# Patient Record
Sex: Male | Born: 1955 | Race: White | Hispanic: No | Marital: Married | Smoking: Current every day smoker
Health system: Southern US, Community
[De-identification: ages and names within clinical notes are randomized; demographics above are authoritative.]

## PROBLEM LIST (undated history)

## (undated) DIAGNOSIS — I10 Essential (primary) hypertension: Secondary | ICD-10-CM

## (undated) DIAGNOSIS — N400 Enlarged prostate without lower urinary tract symptoms: Secondary | ICD-10-CM

## (undated) DIAGNOSIS — F419 Anxiety disorder, unspecified: Secondary | ICD-10-CM

## (undated) DIAGNOSIS — E7800 Pure hypercholesterolemia, unspecified: Secondary | ICD-10-CM

## (undated) HISTORY — DX: Anxiety disorder, unspecified: F41.9

## (undated) HISTORY — DX: Benign prostatic hyperplasia without lower urinary tract symptoms: N40.0

## (undated) HISTORY — DX: Pure hypercholesterolemia, unspecified: E78.00

## (undated) HISTORY — DX: Essential (primary) hypertension: I10

## (undated) HISTORY — PX: TONSILLECTOMY: SUR1361

---

## 2000-10-06 HISTORY — PX: HIP SURGERY: SHX245

## 2008-12-02 ENCOUNTER — Emergency Department: Payer: Self-pay | Admitting: Emergency Medicine

## 2019-12-12 ENCOUNTER — Ambulatory Visit
Admission: RE | Admit: 2019-12-12 | Discharge: 2019-12-12 | Disposition: A | Discharge status: Home / Self Care | Payer: Disability Insurance | Source: Ambulatory Visit | Attending: Thoracic Surgery | Admitting: Thoracic Surgery

## 2019-12-12 ENCOUNTER — Other Ambulatory Visit: Payer: Self-pay | Admitting: Thoracic Surgery

## 2019-12-12 DIAGNOSIS — M25552 Pain in left hip: Secondary | ICD-10-CM | POA: Insufficient documentation

## 2019-12-12 DIAGNOSIS — S3992XS Unspecified injury of lower back, sequela: Secondary | ICD-10-CM | POA: Insufficient documentation

## 2021-11-20 ENCOUNTER — Ambulatory Visit (INDEPENDENT_AMBULATORY_CARE_PROVIDER_SITE_OTHER): Payer: Medicare Other | Admitting: Urology

## 2021-11-20 ENCOUNTER — Other Ambulatory Visit: Payer: Self-pay

## 2021-11-20 ENCOUNTER — Encounter: Payer: Self-pay | Admitting: Urology

## 2021-11-20 VITALS — BP 161/97 | HR 63 | Ht 69.0 in | Wt 185.0 lb

## 2021-11-20 DIAGNOSIS — N410 Acute prostatitis: Secondary | ICD-10-CM | POA: Diagnosis not present

## 2021-11-20 DIAGNOSIS — N4281 Prostatodynia syndrome: Secondary | ICD-10-CM | POA: Diagnosis not present

## 2021-11-20 LAB — URINALYSIS, COMPLETE
Bilirubin, UA: NEGATIVE
Glucose, UA: NEGATIVE
Ketones, UA: NEGATIVE
Nitrite, UA: NEGATIVE
Protein,UA: NEGATIVE
Specific Gravity, UA: 1.015 (ref 1.005–1.030)
Urobilinogen, Ur: 0.2 mg/dL (ref 0.2–1.0)
pH, UA: 7 (ref 5.0–7.5)

## 2021-11-20 LAB — MICROSCOPIC EXAMINATION

## 2021-11-20 LAB — BLADDER SCAN AMB NON-IMAGING

## 2021-11-20 MED ORDER — CIPROFLOXACIN HCL 500 MG PO TABS: 500.0000 mg | ORAL_TABLET | Freq: Two times a day (BID) | ORAL | 0 refills | Status: AC

## 2021-11-20 MED ORDER — TAMSULOSIN HCL 0.4 MG PO CAPS: 0.4000 mg | ORAL_CAPSULE | Freq: Every day | ORAL | 0 refills | Status: DC

## 2021-11-20 NOTE — Progress Notes (Signed)
° °  11/20/21 3:27 PM   Osa Craver. 25-Jul-1956 [id number]  CC: Dysuria, pelvic pain, PSA screening, possible hematuria, ED  HPI: I saw Mr. Clink today for the above issues.  He is a 66 year old male with extensive 40+ pack year smoking history.  He reports about 7 months of intermittent urinary symptoms with terminal dysuria, possible blood in the urine, and perineal achiness.  He has been treated with antibiotics previously by PCP, but those records are not available to me.  He reports he gets improvement with antibiotics, but symptoms have returned.  I do not see any prior UA results from outside PCP, but PSA was normal at 0.5.  He has had trouble with erections for many years, and got no improvement with Viagra and Cialis and these gave him bothersome side effects as well.  He is not interested in resuming sexual activity at this time.  Urinalysis today 0-5 WBCs, 0-2 RBCs, few bacteria, no yeast, nitrite negative, 1+ leukocytes.  PVR normal at 50 mL.  He reportedly had a cystoscopy at least 10 years ago with Dr. Pandora Leiter. [facility] which was reportedly normal.  He had a fair amount of discomfort from this.  Family History: No family history on file.  Social History:  reports that he has been smoking cigarettes. He has never been exposed to tobacco smoke. He has never used smokeless tobacco. He reports current alcohol use. He reports that he does not use drugs.  Physical Exam: BP (!) 161/97    Pulse 63    Ht 5\' 9"  (1.753 m)    Wt 185 lb (83.9 kg)    BMI 27.32 kg/m    Constitutional:  Alert and oriented, No acute distress. Cardiovascular: No clubbing, cyanosis, or edema. Respiratory: Normal respiratory effort, no increased work of breathing. GI: Abdomen is soft, nontender, nondistended, no abdominal masses   Assessment & Plan:   66 year old male with dysuria and pelvic discomfort/perineal achiness, normal PVR, urinalysis with some mild leukocytes but no pyuria or  microscopic hematuria.  We reviewed possible etiologies of his symptoms at length including UTI, prostatitis, chronic prostatitis, chronic pelvic pain, or even malignancy like bladder cancer, prostate cancer, urethral pathology.  I recommended trial of Cipro 500 mg twice daily x3 weeks as well as Flomax nightly for possible prostatitis, with close follow-up in 3 to 4 weeks for likely cystoscopy.  We will repeat UA at that visit and if completely normal and symptoms have resolved can potentially avoid cystoscopy.  Nickolas Madrid, MD 11/20/2021  [facility] [address] [place], [place] [address] [phone]

## 2021-11-20 NOTE — Patient Instructions (Signed)
Prostatitis Prostatitis is swelling or inflammation of the prostate gland, also called the prostate. This gland is about 1.5 inches wide and 1 inch high, and it is involved in making semen. The prostate is located below a man's bladder, in front of the rectum. There are four types of prostatitis: Chronic prostatitis (CP), also called chronic pelvic pain syndrome (CPPS). This is the most common type of prostatitis. It is associated with increased muscle tone in the area between the hip bones (pelvic area), around the prostate. This type is also known as a pelvic floor disorder. Chronic bacterial prostatitis. This type usually results from an acute bacterial infection in the prostate gland that keeps coming back or has not been treated properly. The symptoms are less severe than those caused by acute bacterial prostatitis, which lasts a shorter time. Asymptomatic inflammatory prostatitis. This type does not have symptoms and does not need treatment. This is diagnosed when tests are done for other disorders of the urinary tract or reproductive tract. Acute bacterial prostatitis. This type starts quickly and results from an acute bacterial infection in the prostate gland. It is usually associated with a bladder infection, high fever, and chills. This is the least common type of prostatitis. What are the causes? Bacterial prostatitis is caused by an infection from bacteria. Chronic nonbacterial prostatitis may be caused by: Factors related to the nervous system. This system includes thebrain, spinal cord, and nerves. An autoimmune response. This happens when the body's disease-fighting system attacks healthy tissue in the body by mistake. Psychological factors. These have to do with how the mind works. The causes of the other types of prostatitis are usually not known. What are the signs or symptoms? Symptoms of this condition depend on the type of prostatitis you have. Acute bacterial  prostatitis Symptoms may include: Pain or burning during urination. Frequent and sudden urges to urinate. Trouble starting to urinate. Fever. Chills. Pain in your muscles or joints, lower back, or lower abdomen. Other types of prostatitis Symptoms may include: Sudden urges to urinate, or urinating often. Trouble starting to urinate. Weak urine stream. Dribbling after urination. Discharge coming from the penis. Pain in the testicles, the penis, or the tip of the penis. Pain in the area in front of the rectum and below the scrotum (perineum). Pain when ejaculating. How is this diagnosed? This condition may be diagnosed based on: A physical and medical exam. A digital rectal exam. For this, the health care provider may use a finger to feel the prostate. A urine test to check for bacteria. A semen sample or blood tests. Ultrasound. Urodynamic tests to check how your body handles urine. Cystoscopy to look inside your bladder or inside the part of your body that drains urine from the bladder (urethra). How is this treated? Treatment for this condition depends on the type of prostatitis. Treatment may involve: Medicines to relieve pain or inflammation, or to help relax your muscles. Physical therapy. Heat therapy. Biofeedback. These techniques help you control certain body functions. Relaxation exercises. Antibiotic medicine, if your condition is caused by bacteria. Sitz baths. These warm water baths help to relax your pelvic floor muscles, which helps to relieve pressure on the prostate. Follow these instructions at home: Medicines Take over-the-counter and prescription medicines only as told by your health care provider. If you were prescribed an antibiotic medicine, take it as told by your health care provider. Do not stop using the antibiotic even if you start to feel better. Managing pain and swelling  Take sitz baths as directed by your health care provider. For a sitz bath,  sit in warm water that is deep enough to cover your hips and buttocks. If directed, apply heat to the affected area as often as told by your health care provider. Use the heat source that your health care provider recommends, such as a moist heat pack or a heating pad. Place a towel between your skin and the heat source. Leave the heat on for 20-30 minutes. Remove the heat if your skin turns bright red. This is especially important if you are unable to feel pain, heat, or cold. You may have a greater risk of getting burned. General instructions Do exercises as told by your health care provider, if you were prescribed physical therapy, biofeedback, or relaxation exercises. Keep all follow-up visits as told by your health care provider. This is important. Where to find more information [employer] of Diabetes and Digestive and Kidney Diseases: http://www.bass.com/ Contact a health care provider if: Your symptoms get worse. You have a fever. Get help right away if: You have chills. You feel light-headed or feel like you may faint. You cannot urinate. You have blood or blood clots in your urine. Summary Prostatitis is swelling or inflammation of the prostate gland. Treatment for this condition depends on the type of prostatitis. Take over-the-counter and prescription medicines only as told by your health care provider. Get help right away of you have chills, feel light-headed, feel like you may faint, cannot urinate, or have blood or blood clots in your urine. This information is not intended to replace advice given to you by your health care provider. Make sure you discuss any questions you have with your health care provider. Document Revised: 10/28/2019 Document Reviewed: 10/28/2019 Elsevier Patient Education  2022 [place].  Cystoscopy Cystoscopy is a procedure that is used to help diagnose and sometimes treat conditions that affect the lower urinary tract. The lower  urinary tract includes the bladder and the urethra. The urethra is the tube that drains urine from the bladder. Cystoscopy is done using a thin, tube-shaped instrument with a light and camera at the end (cystoscope). The cystoscope may be hard or flexible, depending on the goal of the procedure. The cystoscope is inserted through the urethra, into the bladder. Cystoscopy may be recommended if you have: Urinary tract infections that keep coming back. Blood in the urine (hematuria). An inability to control when you urinate (urinary incontinence) or an overactive bladder. Unusual cells found in a urine sample. A blockage in the urethra, such as a urinary stone. Painful urination. An abnormality in the bladder found during an intravenous pyelogram (IVP) or CT scan. What are the risks? Generally, this is a safe procedure. However, problems may occur, including: Infection. Bleeding.  What happens during the procedure?  You will be given one or more of the following: A medicine to numb the area (local anesthetic). The area around the opening of your urethra will be cleaned. The cystoscope will be passed through your urethra into your bladder. Germ-free (sterile) fluid will flow through the cystoscope to fill your bladder. The fluid will stretch your bladder so that your health care provider can clearly examine your bladder walls. Your doctor will look at the urethra and bladder. The cystoscope will be removed The procedure may vary among health care providers  What can I expect after the procedure? After the procedure, it is common to have: Some soreness or pain in your urethra. Urinary symptoms.  These include: Mild pain or burning when you urinate. Pain should stop within a few minutes after you urinate. This may last for up to a few days after the procedure. A small amount of blood in your urine for several days. Feeling like you need to urinate but producing only a small amount of  urine. Follow these instructions at home: General instructions Return to your normal activities as told by your health care provider.  Drink plenty of fluids after the procedure. Keep all follow-up visits as told by your health care provider. This is important. Contact a health care provider if you: Have pain that gets worse or does not get better with medicine, especially pain when you urinate lasting longer than 72 hours after the procedure. Have trouble urinating. Get help right away if you: Have blood clots in your urine. Have a fever or chills. Are unable to urinate. Summary Cystoscopy is a procedure that is used to help diagnose and sometimes treat conditions that affect the lower urinary tract. Cystoscopy is done using a thin, tube-shaped instrument with a light and camera at the end. After the procedure, it is common to have some soreness or pain in your urethra. It is normal to have blood in your urine after the procedure.  If you were prescribed an antibiotic medicine, take it as told by your health care provider.  This information is not intended to replace advice given to you by your health care provider. Make sure you discuss any questions you have with your health care provider. Document Revised: 09/14/2018 Document Reviewed: 09/14/2018 Elsevier Patient Education  [place].

## 2021-11-20 NOTE — Addendum Note (Signed)
Addended by: Despina Hidden on: 11/20/2021 03:39 PM   Modules accepted: Orders

## 2021-11-23 LAB — CULTURE, URINE COMPREHENSIVE

## 2021-12-12 ENCOUNTER — Other Ambulatory Visit: Payer: Self-pay | Admitting: Nephrology

## 2021-12-12 DIAGNOSIS — F419 Anxiety disorder, unspecified: Secondary | ICD-10-CM | POA: Insufficient documentation

## 2021-12-12 DIAGNOSIS — N400 Enlarged prostate without lower urinary tract symptoms: Secondary | ICD-10-CM | POA: Insufficient documentation

## 2021-12-12 DIAGNOSIS — N401 Enlarged prostate with lower urinary tract symptoms: Secondary | ICD-10-CM

## 2021-12-12 DIAGNOSIS — N1831 Chronic kidney disease, stage 3a: Secondary | ICD-10-CM

## 2021-12-12 DIAGNOSIS — R82998 Other abnormal findings in urine: Secondary | ICD-10-CM

## 2021-12-12 DIAGNOSIS — E785 Hyperlipidemia, unspecified: Secondary | ICD-10-CM

## 2021-12-12 DIAGNOSIS — I10 Essential (primary) hypertension: Secondary | ICD-10-CM | POA: Insufficient documentation

## 2021-12-12 DIAGNOSIS — R3129 Other microscopic hematuria: Secondary | ICD-10-CM

## 2021-12-19 ENCOUNTER — Ambulatory Visit
Admission: RE | Admit: 2021-12-19 | Discharge: 2021-12-19 | Disposition: A | Discharge status: Home / Self Care | Payer: Medicare HMO | Source: Ambulatory Visit | Attending: Nephrology | Admitting: Nephrology

## 2021-12-19 ENCOUNTER — Other Ambulatory Visit: Payer: Self-pay

## 2021-12-19 DIAGNOSIS — F419 Anxiety disorder, unspecified: Secondary | ICD-10-CM | POA: Insufficient documentation

## 2021-12-19 DIAGNOSIS — N401 Enlarged prostate with lower urinary tract symptoms: Secondary | ICD-10-CM | POA: Insufficient documentation

## 2021-12-19 DIAGNOSIS — R3129 Other microscopic hematuria: Secondary | ICD-10-CM | POA: Diagnosis present

## 2021-12-19 DIAGNOSIS — N1831 Chronic kidney disease, stage 3a: Secondary | ICD-10-CM | POA: Diagnosis present

## 2021-12-19 DIAGNOSIS — E785 Hyperlipidemia, unspecified: Secondary | ICD-10-CM | POA: Diagnosis present

## 2021-12-19 DIAGNOSIS — R82998 Other abnormal findings in urine: Secondary | ICD-10-CM | POA: Diagnosis not present

## 2021-12-25 ENCOUNTER — Other Ambulatory Visit: Payer: Self-pay

## 2021-12-25 ENCOUNTER — Encounter: Payer: Self-pay | Admitting: Urology

## 2021-12-25 ENCOUNTER — Ambulatory Visit (INDEPENDENT_AMBULATORY_CARE_PROVIDER_SITE_OTHER): Payer: Medicare HMO | Admitting: Urology

## 2021-12-25 VITALS — BP 184/99 | HR 68 | Ht 69.0 in | Wt 185.0 lb

## 2021-12-25 DIAGNOSIS — N410 Acute prostatitis: Secondary | ICD-10-CM

## 2021-12-25 NOTE — Progress Notes (Signed)
? ?  12/25/2021 ?3:20 PM  ? ?Martin Gomez. ?13-Mar-1956 ?[id number] ? ?Reason for visit: Follow up urinary symptoms, prostatitis,dysuria ? ?HPI: ?66 year old male who I saw on 11/20/2021 with about 6 months of urinary symptoms including terminal dysuria, questionable blood in the urine, and perineal pain/achiness.  He had some temporary improvement with short courses of antibiotics previously.  His clinical picture was most consistent with prostatitis, culture ultimately grew Klebsiella, and he was treated with 3 weeks of Cipro and Flomax.  He reports almost complete resolution in his symptoms since that time.  He denies any gross hematuria or dysuria, there was no microscopic hematuria on his prior urinalysis.  He is off the Flomax, and does not feel this significantly improved his urinary stream. ? ?We again discussed considering cystoscopy today for further evaluation of urethral stricture or other pathology, but he would like to hold off on cystoscopy, as he had one about 10 years ago that was extremely painful. ? ?Reassuringly, he had a renal/bladder ultrasound(ordered by nephrology for dipstick trace blood) recently on 12/19/2021 that I personally reviewed and interpreted and shows no hydronephrosis or evidence of bladder lesion. ? ?Return precautions were discussed extensively including gross hematuria, worsening urinary symptoms, or dysuria. ? ?RTC 6 months PVR and symptom check, consider cystoscopy again in the future if recurrence of symptoms ? ?Billey Co, MD ? ?[facility] ?[address] ?[place], [place] [address] ?[phone] ? ? ?

## 2021-12-26 LAB — URINALYSIS, COMPLETE
Bilirubin, UA: NEGATIVE
Glucose, UA: NEGATIVE
Ketones, UA: NEGATIVE
Leukocytes,UA: NEGATIVE
Nitrite, UA: NEGATIVE
Protein,UA: NEGATIVE
Specific Gravity, UA: 1.015 (ref 1.005–1.030)
Urobilinogen, Ur: 0.2 mg/dL (ref 0.2–1.0)
pH, UA: 7 (ref 5.0–7.5)

## 2021-12-26 LAB — MICROSCOPIC EXAMINATION
Bacteria, UA: NONE SEEN
Epithelial Cells (non renal): NONE SEEN /hpf (ref 0–10)

## 2022-04-24 ENCOUNTER — Encounter: Payer: Self-pay | Admitting: Urology

## 2022-04-24 ENCOUNTER — Ambulatory Visit: Payer: Medicare HMO | Admitting: Urology

## 2022-04-24 VITALS — BP 137/87 | HR 69 | Ht 68.0 in | Wt 185.0 lb

## 2022-04-24 DIAGNOSIS — R300 Dysuria: Secondary | ICD-10-CM | POA: Diagnosis not present

## 2022-04-24 DIAGNOSIS — R310 Gross hematuria: Secondary | ICD-10-CM | POA: Diagnosis not present

## 2022-04-24 DIAGNOSIS — Z87438 Personal history of other diseases of male genital organs: Secondary | ICD-10-CM

## 2022-04-24 DIAGNOSIS — N4281 Prostatodynia syndrome: Secondary | ICD-10-CM | POA: Diagnosis not present

## 2022-04-24 LAB — URINALYSIS, COMPLETE
Bilirubin, UA: NEGATIVE
Glucose, UA: NEGATIVE
Ketones, UA: NEGATIVE
Nitrite, UA: NEGATIVE
Protein,UA: NEGATIVE
RBC, UA: NEGATIVE
Specific Gravity, UA: <1.005 — ABNORMAL LOW (ref 1.005–1.030)
Urobilinogen, Ur: 0.2 mg/dL (ref 0.2–1.0)
pH, UA: 6.5 (ref 5.0–7.5)

## 2022-04-24 LAB — MICROSCOPIC EXAMINATION

## 2022-04-24 LAB — BLADDER SCAN AMB NON-IMAGING

## 2022-04-24 MED ORDER — DIAZEPAM 5 MG PO TABS: 5.0000 mg | ORAL_TABLET | Freq: Once | ORAL | 0 refills | Status: DC | PRN

## 2022-04-24 MED ORDER — SULFAMETHOXAZOLE-TRIMETHOPRIM 800-160 MG PO TABS: 1.0000 | ORAL_TABLET | Freq: Two times a day (BID) | ORAL | 0 refills | Status: AC

## 2022-04-24 NOTE — Patient Instructions (Signed)
Prostatitis  Prostatitis is swelling or inflammation of the prostate gland, also called the prostate. This gland is about 1.5 inches wide and 1 inch high, and it is involved in making semen. The prostate is located below a man's bladder, in front of the rectum. There are four types of prostatitis: Chronic prostatitis (CP), also called chronic pelvic pain syndrome (CPPS). This is the most common type of prostatitis. It is associated with increased muscle tone in the area between the hip bones (pelvic area), around the prostate. This type is also known as a pelvic floor disorder. Chronic bacterial prostatitis. This type usually results from an acute bacterial infection in the prostate gland that keeps coming back or has not been treated properly. The symptoms are less severe than those caused by acute bacterial prostatitis, which lasts a shorter time. Asymptomatic inflammatory prostatitis. This type does not have symptoms and does not need treatment. This is diagnosed when tests are done for other disorders of the urinary tract or reproductive tract. Acute bacterial prostatitis. This type starts quickly and results from an acute bacterial infection in the prostate gland. It is usually associated with a bladder infection, high fever, and chills. This is the least common type of prostatitis. What are the causes? Bacterial prostatitis is caused by an infection from bacteria. Chronic nonbacterial prostatitis may be caused by: Factors related to the nervous system. This system includes thebrain, spinal cord, and nerves. An autoimmune response. This happens when the body's disease-fighting system attacks healthy tissue in the body by mistake. Psychological factors. These have to do with how the mind works. The causes of the other types of prostatitis are usually not known. What are the signs or symptoms? Symptoms of this condition depend on the type of prostatitis you have. Acute bacterial  prostatitis Symptoms may include: Pain or burning during urination. Frequent and sudden urges to urinate. Trouble starting to urinate. Fever. Chills. Pain in your muscles or joints, lower back, or lower abdomen. Other types of prostatitis Symptoms may include: Sudden urges to urinate, or urinating often. Trouble starting to urinate. Weak urine stream. Dribbling after urination. Discharge coming from the penis. Pain in the testicles, the penis, or the tip of the penis. Pain in the area in front of the rectum and below the scrotum (perineum). Pain when ejaculating. How is this diagnosed? This condition may be diagnosed based on: A physical and medical exam. A digital rectal exam. For this, the health care provider may use a finger to feel the prostate. A urine test to check for bacteria. A semen sample or blood tests. Ultrasound. Urodynamic tests to check how your body handles urine. Cystoscopy to look inside your bladder or inside the part of your body that drains urine from the bladder (urethra). How is this treated? Treatment for this condition depends on the type of prostatitis. Treatment may involve: Medicines to relieve pain or inflammation, or to help relax your muscles. Physical therapy. Heat therapy. Biofeedback. These techniques help you control certain body functions. Relaxation exercises. Antibiotic medicine, if your condition is caused by bacteria. Sitz baths. These warm water baths help to relax your pelvic floor muscles, which helps to relieve pressure on the prostate. Follow these instructions at home: Medicines Take over-the-counter and prescription medicines only as told by your health care provider. If you were prescribed an antibiotic medicine, take it as told by your health care provider. Do not stop using the antibiotic even if you start to feel better. Managing pain and swelling  Take sitz baths as directed by your health care provider. For a sitz bath,  sit in warm water that is deep enough to cover your hips and buttocks. If directed, apply heat to the affected area as often as told by your health care provider. Use the heat source that your health care provider recommends, such as a moist heat pack or a heating pad. Place a towel between your skin and the heat source. Leave the heat on for 20-30 minutes. Remove the heat if your skin turns bright red. This is especially important if you are unable to feel pain, heat, or cold. You may have a greater risk of getting burned. General instructions Do exercises as told by your health care provider, if you were prescribed physical therapy, biofeedback, or relaxation exercises. Keep all follow-up visits as told by your health care provider. This is important. Where to find more information [employer] of Diabetes and Digestive and Kidney Diseases: http://www.bass.com/ Contact a health care provider if: Your symptoms get worse. You have a fever. Get help right away if: You have chills. You feel light-headed or feel like you may faint. You cannot urinate. You have blood or blood clots in your urine. Summary Prostatitis is swelling or inflammation of the prostate gland. Treatment for this condition depends on the type of prostatitis. Take over-the-counter and prescription medicines only as told by your health care provider. Get help right away of you have chills, feel light-headed, feel like you may faint, cannot urinate, or have blood or blood clots in your urine. This information is not intended to replace advice given to you by your health care provider. Make sure you discuss any questions you have with your health care provider. Document Revised: 10/28/2019 Document Reviewed: 10/28/2019 Elsevier Patient Education  [place].   Cystoscopy Cystoscopy is a procedure that is used to help diagnose and sometimes treat conditions that affect the lower urinary tract. The lower  urinary tract includes the bladder and the urethra. The urethra is the tube that drains urine from the bladder. Cystoscopy is done using a thin, tube-shaped instrument with a light and camera at the end (cystoscope). The cystoscope may be hard or flexible, depending on the goal of the procedure. The cystoscope is inserted through the urethra, into the bladder. Cystoscopy may be recommended if you have: Urinary tract infections that keep coming back. Blood in the urine (hematuria). An inability to control when you urinate (urinary incontinence) or an overactive bladder. Unusual cells found in a urine sample. A blockage in the urethra, such as a urinary stone. Painful urination. An abnormality in the bladder found during an intravenous pyelogram (IVP) or CT scan. What are the risks? Generally, this is a safe procedure. However, problems may occur, including: Infection. Bleeding.  What happens during the procedure?  You will be given one or more of the following: A medicine to numb the area (local anesthetic). The area around the opening of your urethra will be cleaned. The cystoscope will be passed through your urethra into your bladder. Germ-free (sterile) fluid will flow through the cystoscope to fill your bladder. The fluid will stretch your bladder so that your health care provider can clearly examine your bladder walls. Your doctor will look at the urethra and bladder. The cystoscope will be removed The procedure may vary among health care providers  What can I expect after the procedure? After the procedure, it is common to have: Some soreness or pain in your urethra. Urinary  symptoms. These include: Mild pain or burning when you urinate. Pain should stop within a few minutes after you urinate. This may last for up to a few days after the procedure. A small amount of blood in your urine for several days. Feeling like you need to urinate but producing only a small amount of  urine. Follow these instructions at home: General instructions Return to your normal activities as told by your health care provider.  Drink plenty of fluids after the procedure. Keep all follow-up visits as told by your health care provider. This is important. Contact a health care provider if you: Have pain that gets worse or does not get better with medicine, especially pain when you urinate lasting longer than 72 hours after the procedure. Have trouble urinating. Get help right away if you: Have blood clots in your urine. Have a fever or chills. Are unable to urinate. Summary Cystoscopy is a procedure that is used to help diagnose and sometimes treat conditions that affect the lower urinary tract. Cystoscopy is done using a thin, tube-shaped instrument with a light and camera at the end. After the procedure, it is common to have some soreness or pain in your urethra. It is normal to have blood in your urine after the procedure.  If you were prescribed an antibiotic medicine, take it as told by your health care provider.  This information is not intended to replace advice given to you by your health care provider. Make sure you discuss any questions you have with your health care provider. Document Revised: 09/14/2018 Document Reviewed: 09/14/2018 Elsevier Patient Education  [place].

## 2022-04-24 NOTE — Progress Notes (Signed)
   04/24/2022 11:31 AM   Martin Gomez. 12-17-1955 [id number]  Reason for visit: Dysuria, gross hematuria, history of prostatitis  HPI: 66 year old male who I saw in February March 2023 for prostatitis with culture growing Klebsiella and symptoms resolved on Cipro.  I had recommended cystoscopy, but he deferred.  A renal and bladder ultrasound that was ordered by nephrology in March 2023 showed no hydronephrosis or bladder lesions.  He presented to clinic today with some intermittent dysuria as well as gross hematuria and stringy blood clots.  PVR today is normal at 19 mL.  Urinalysis with 6-10 WBCs, 0-2 RBCs, few bacteria, no yeast, nitrite negative, 1+ leukocytes.  Will send for culture.  We reviewed possible etiologies including prostatitis, urethral stricture, BPH, or malignancy.  I recommended starting Bactrim DS twice daily x4 weeks for possible prostatitis with his history of infections, follow-up urine culture, and close follow-up for CT urogram with the significant bleeding and clots as well as recurrent infections, and cystoscopy for further evaluation.  Billey Co, [facility] [address], [place],  [address] [phone]

## 2022-04-28 LAB — CULTURE, URINE COMPREHENSIVE

## 2022-04-30 ENCOUNTER — Ambulatory Visit: Admission: RE | Admit: 2022-04-30 | Payer: Medicare HMO | Source: Ambulatory Visit

## 2022-05-07 ENCOUNTER — Ambulatory Visit
Admission: RE | Admit: 2022-05-07 | Discharge: 2022-05-07 | Disposition: A | Discharge status: Home / Self Care | Payer: Medicare HMO | Source: Ambulatory Visit | Attending: Urology | Admitting: Urology

## 2022-05-07 DIAGNOSIS — D3502 Benign neoplasm of left adrenal gland: Secondary | ICD-10-CM | POA: Diagnosis not present

## 2022-05-07 DIAGNOSIS — Z87438 Personal history of other diseases of male genital organs: Secondary | ICD-10-CM | POA: Insufficient documentation

## 2022-05-07 DIAGNOSIS — N200 Calculus of kidney: Secondary | ICD-10-CM | POA: Diagnosis not present

## 2022-05-07 DIAGNOSIS — R310 Gross hematuria: Secondary | ICD-10-CM | POA: Diagnosis not present

## 2022-05-07 LAB — POCT I-STAT CREATININE: Creatinine, Ser: 1.4 mg/dL — ABNORMAL HIGH (ref 0.61–1.24)

## 2022-05-07 MED ORDER — IOHEXOL 300 MG/ML  SOLN
125.0000 mL | Freq: Once | INTRAMUSCULAR | Status: AC | PRN
  Administered 2022-05-07: 125 mL via INTRAVENOUS

## 2022-05-08 ENCOUNTER — Telehealth: Payer: Self-pay

## 2022-05-08 NOTE — Telephone Encounter (Signed)
Called pt informed him of the information below. Pt voiced understanding.

## 2022-05-08 NOTE — Telephone Encounter (Signed)
-----   Message from Billey Co, MD sent at 05/08/2022 10:28 AM EDT ----- Good news, no worrisome findings on CT scan, keep follow up as scheduled  Nickolas Madrid, MD 05/08/2022

## 2022-05-21 ENCOUNTER — Encounter: Payer: Self-pay | Admitting: Urology

## 2022-05-21 ENCOUNTER — Ambulatory Visit (INDEPENDENT_AMBULATORY_CARE_PROVIDER_SITE_OTHER): Payer: Medicare HMO | Admitting: Urology

## 2022-05-21 VITALS — BP 131/71 | HR 79 | Ht 68.0 in | Wt 185.0 lb

## 2022-05-21 DIAGNOSIS — R310 Gross hematuria: Secondary | ICD-10-CM

## 2022-05-21 MED ORDER — LIDOCAINE HCL URETHRAL/MUCOSAL 2 % EX GEL
1.0000 | Freq: Once | CUTANEOUS | Status: AC
  Administered 2022-05-21: 1 via URETHRAL

## 2022-05-21 NOTE — Patient Instructions (Signed)

## 2022-05-21 NOTE — Progress Notes (Signed)
Cystoscopy Procedure Note:  Indication: Recurrent UTI/prostatitis, gross hematuria with clots  After informed consent and discussion of the procedure and its risks, Daylen Lipsky. was positioned and prepped in the standard fashion. Cystoscopy was performed with a flexible cystoscope. The urethra, bladder neck and entire bladder was visualized in a standard fashion. The prostate was moderate in size. The ureteral orifices were visualized in their normal location and orientation.  Bladder mucosa grossly normal throughout, no suspicious lesions, no abnormalities on retroflexion.  Imaging: CT urogram 05/08/2022 with no hydronephrosis or significant stone burden, gas in the bladder which may be from recent infection.  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  He has continued to improve on a month-long course of culture appropriate Bactrim for urine culture with Enterobacter on 04/24/2022.  Hematuria has resolved.  I recommended starting cranberry tablets to help with UTI prevention, and we could consider HOLEP in the future if recurrent infections.  He has previously trialed Flomax with minimal to no improvement.  Return precautions discussed at length.  RTC 6 months PVR  Nickolas Madrid, MD 05/21/2022

## 2022-07-10 ENCOUNTER — Ambulatory Visit: Payer: Medicare HMO | Admitting: Urology

## 2022-08-06 ENCOUNTER — Ambulatory Visit: Payer: Medicare PPO | Admitting: Urology

## 2022-08-06 VITALS — BP 154/79 | HR 73 | Ht 68.0 in | Wt 185.0 lb

## 2022-08-06 DIAGNOSIS — R310 Gross hematuria: Secondary | ICD-10-CM

## 2022-08-06 DIAGNOSIS — R300 Dysuria: Secondary | ICD-10-CM | POA: Diagnosis not present

## 2022-08-06 LAB — URINALYSIS, COMPLETE
Bilirubin, UA: NEGATIVE
Glucose, UA: NEGATIVE
Ketones, UA: NEGATIVE
Nitrite, UA: NEGATIVE
Protein,UA: NEGATIVE
RBC, UA: NEGATIVE
Specific Gravity, UA: <1.005 — ABNORMAL LOW (ref 1.005–1.030)
Urobilinogen, Ur: 0.2 mg/dL (ref 0.2–1.0)
pH, UA: 6 (ref 5.0–7.5)

## 2022-08-06 LAB — BLADDER SCAN AMB NON-IMAGING: Scan Result: 12

## 2022-08-06 LAB — MICROSCOPIC EXAMINATION

## 2022-08-06 MED ORDER — DOXYCYCLINE HYCLATE 100 MG PO CAPS: 100.0000 mg | ORAL_CAPSULE | Freq: Two times a day (BID) | ORAL | 0 refills | Status: AC

## 2022-08-06 NOTE — Progress Notes (Signed)
   08/06/2022 11:49 AM   Martin Gomez. [birth year] [id number]  Reason for visit: Follow up recurrent UTI/prostatitis, gross hematuria  HPI: 66 year old male who has had problems over the last 6 months with intermittent dysuria and UTI/prostatitis as well as gross hematuria with small clots.  PVRs have been normal.  He ultimately was treated with a 4-week course of Bactrim DS twice daily which resolved his symptoms.  Secondary to his significant hematuria he also opted for work-up with CT urogram and cystoscopy.  CT on 05/07/2022 was benign aside from gas in the bladder felt to be secondary to recent infection, and cystoscopy on 05/21/2022 was normal with no abnormalities or suspicious lesions.  He made an appointment today because he has continued to have intermittent gross hematuria with small clots.  He denies any dysuria or urinary symptoms.  Urinalysis today is suspicious for infection with 6-10 WBCs, 0-2 RBC, few bacteria, nitrite negative, trace leukocytes.  Will send for culture.  We discussed possible etiologies including BPH, prostatitis, or malignancy.  I think with his ongoing hematuria it is worth sending a urine cytology, and following up urine culture.  I started him on a 1 month course of doxycycline 100 mg twice daily for possible infection based on his history of prostatitis and urinalysis that appears infected.  Call with urine culture and urine cytology results Doxycycline 100 mg twice daily x1 month for suspected prostatitis   Billey Co, [place] [address] [place], [place] [address] [phone]

## 2022-08-08 LAB — CYTOLOGY - NON PAP

## 2022-08-10 LAB — CULTURE, URINE COMPREHENSIVE

## 2022-08-12 ENCOUNTER — Telehealth: Payer: Self-pay

## 2022-08-12 NOTE — Telephone Encounter (Signed)
Called no answer. Left detailed message per DPR advising pt of information below. Advised pt to call back for questions or concerns.

## 2022-08-12 NOTE — Telephone Encounter (Signed)
-----   Message from Billey Co, MD sent at 08/11/2022 10:22 AM EST ----- Good news, no cancer cells on urine specimen. Bacteria was sensitive to antibiotic, keep follow up as scheduled  Nickolas Madrid, MD 08/11/2022

## 2022-09-10 ENCOUNTER — Ambulatory Visit (INDEPENDENT_AMBULATORY_CARE_PROVIDER_SITE_OTHER): Payer: Medicare PPO | Admitting: Urology

## 2022-09-10 ENCOUNTER — Encounter: Payer: Self-pay | Admitting: Urology

## 2022-09-10 VITALS — BP 136/83 | HR 79 | Ht 69.0 in | Wt 185.0 lb

## 2022-09-10 DIAGNOSIS — R310 Gross hematuria: Secondary | ICD-10-CM

## 2022-09-10 DIAGNOSIS — N411 Chronic prostatitis: Secondary | ICD-10-CM

## 2022-09-10 MED ORDER — FINASTERIDE 5 MG PO TABS: 5.0000 mg | ORAL_TABLET | Freq: Every day | ORAL | 11 refills | Status: DC

## 2022-09-10 MED ORDER — SULFAMETHOXAZOLE-TRIMETHOPRIM 800-160 MG PO TABS: 1.0000 | ORAL_TABLET | Freq: Two times a day (BID) | ORAL | 0 refills | Status: AC

## 2022-09-10 NOTE — Progress Notes (Signed)
   09/10/2022 12:38 PM   Martin Gomez. Sep 01, 1956 [id number]  Reason for visit: Follow up recurrent UTI/prostatitis, gross hematuria  HPI: 66 year old male who has had problems over the last 8 months with intermittent dysuria and UTI/prostatitis as well as gross hematuria with small clots.  PVRs have been normal.  He ultimately was treated with a 4-week course of Bactrim DS twice daily which resolved his symptoms.  Secondary to his significant hematuria he also opted for work-up with CT urogram and cystoscopy.  CT on 05/07/2022 was benign aside from gas in the bladder felt to be secondary to recent infection, and cystoscopy on 05/21/2022 was normal with no abnormalities or suspicious lesions.  He developed recurrence of some dysuria and urinary symptoms as well as gross hematuria on 08/06/2022 and urine culture grew Klebsiella, he was treated with a 4-week course of doxycycline which resolved his urinary symptoms and hematuria for a few weeks, however his hematuria has recurred over the last few weeks with some small clots.  Urine cytology at that visit was negative.  Urinalysis and culture are pending today.  DRE today 30 g gland, benign with no nodules or masses.  We discussed possible etiologies including BPH, prostatitis, or malignancy.  His hematuria and symptoms have seemed to improve with antibiotics previously which lead to prostatitis being a more likely diagnosis, and workup for malignancy including CT, cystoscopy, and voided cytology have all been negative.  We reviewed possible benefit of finasteride for any BPH component, and he was interested in starting this as well, as well as consideration of an outlet procedure like HOLEP in the future to remove this prostate tissue that continues to get infected.  He is very hesitant to undergo any surgery.  -Call with urine culture and urine cytology results -Bactrim DS twice daily x 6 weeks -Start finasteride -RTC 2 to 3 months symptom  check   Billey Co, MD  [facility] [address], [place], [place] [address] [phone]

## 2022-09-11 ENCOUNTER — Telehealth: Payer: Self-pay

## 2022-09-11 LAB — URINALYSIS, COMPLETE
Bilirubin, UA: NEGATIVE
Glucose, UA: NEGATIVE
Ketones, UA: NEGATIVE
Leukocytes,UA: NEGATIVE
Nitrite, UA: NEGATIVE
Protein,UA: NEGATIVE
Specific Gravity, UA: <1.005 — ABNORMAL LOW (ref 1.005–1.030)
Urobilinogen, Ur: 0.2 mg/dL (ref 0.2–1.0)
pH, UA: 6 (ref 5.0–7.5)

## 2022-09-11 LAB — MICROSCOPIC EXAMINATION: Bacteria, UA: NONE SEEN

## 2022-09-11 NOTE — Telephone Encounter (Signed)
Called pt informed him of the information below. Pt voiced understanding.

## 2022-09-11 NOTE — Telephone Encounter (Signed)
-----   Message from Billey Co, MD sent at 09/11/2022  7:40 AM EST ----- UA looks normal, no blood or obvious infection, take bactrim and finasteride as recommended, keep February follow up as scheduled  Nickolas Madrid, MD 09/11/2022

## 2022-09-14 LAB — CULTURE, URINE COMPREHENSIVE

## 2022-09-17 IMAGING — US US RENAL
1 series · 15 of 25 positions shown · non-contrast
Comparison: None.

CLINICAL DATA: Microscopic hematuria

EXAM:
RENAL / URINARY TRACT ULTRASOUND COMPLETE

[Series 1: us renal · 15 of 80 slices shown]
[im 1/80]
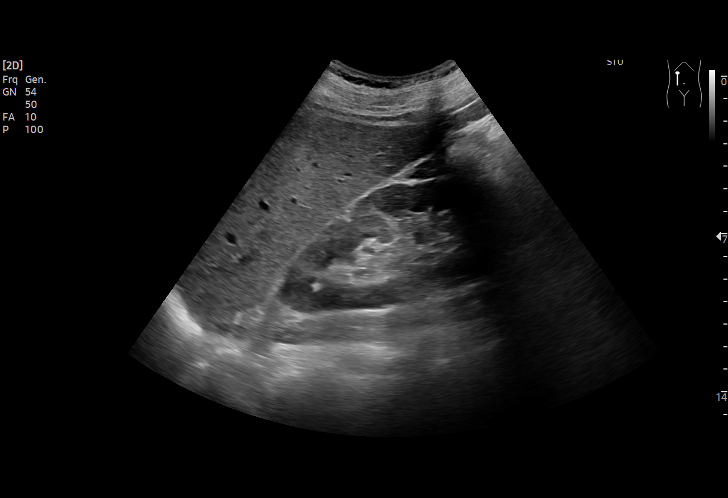
[im 7/80]
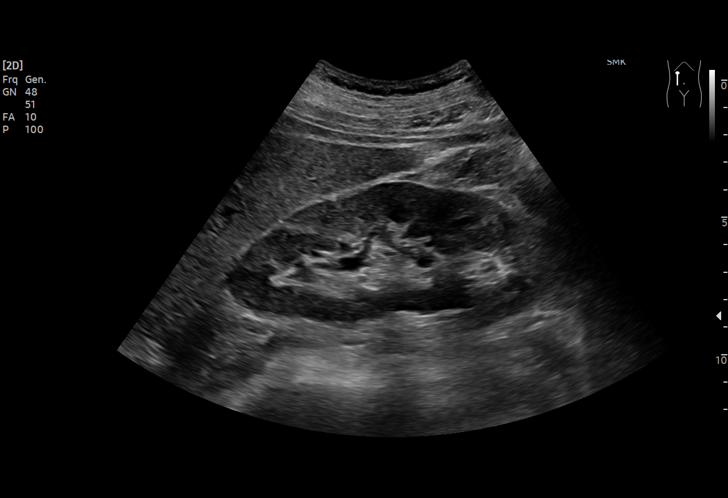
[im 14/80]
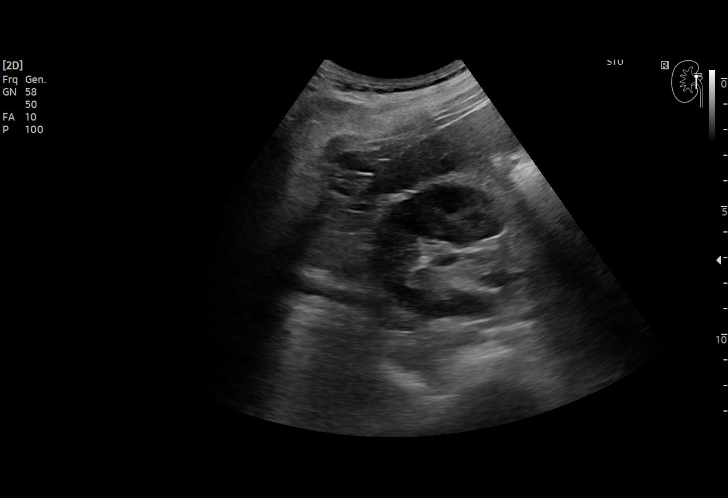
[im 17/80]
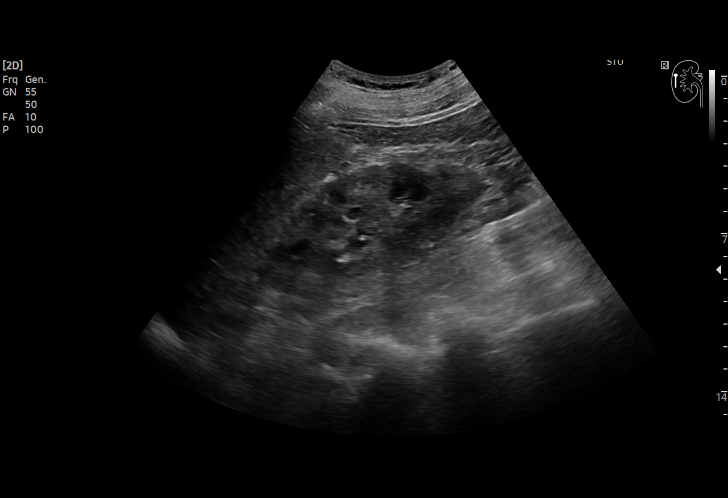
[im 24/80]
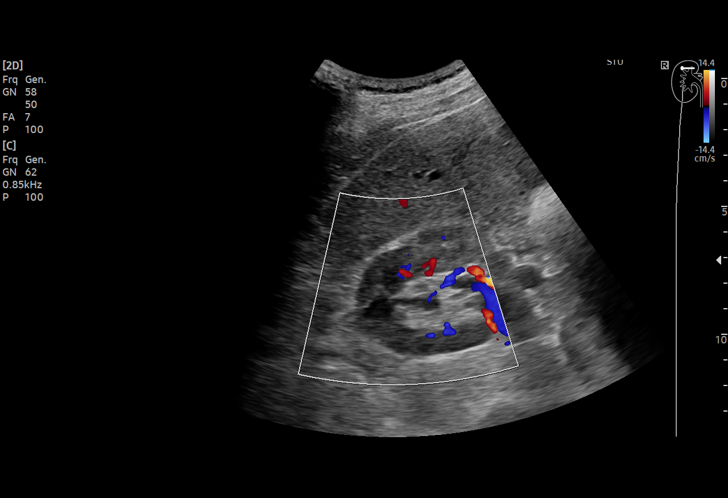
[im 30/80]
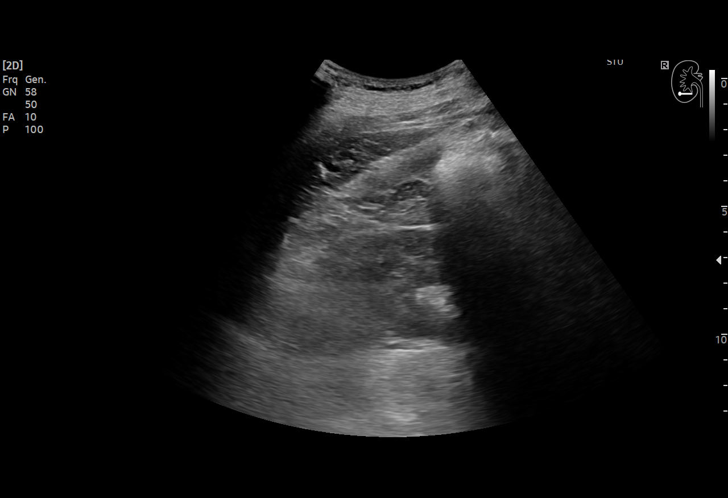
[im 33/80]
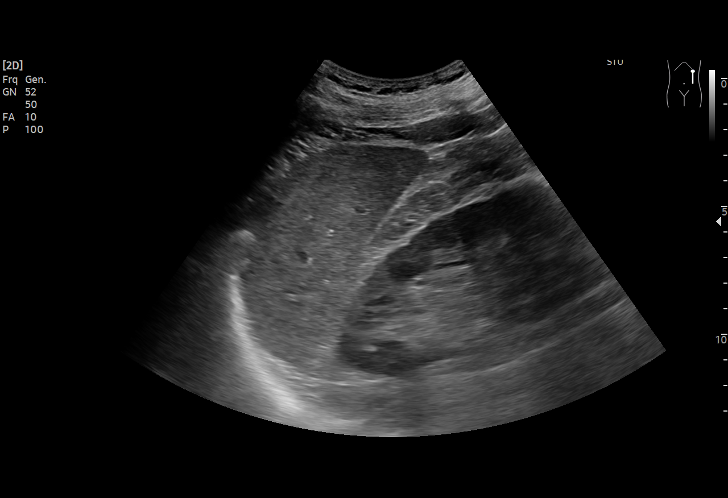
[im 40/80]
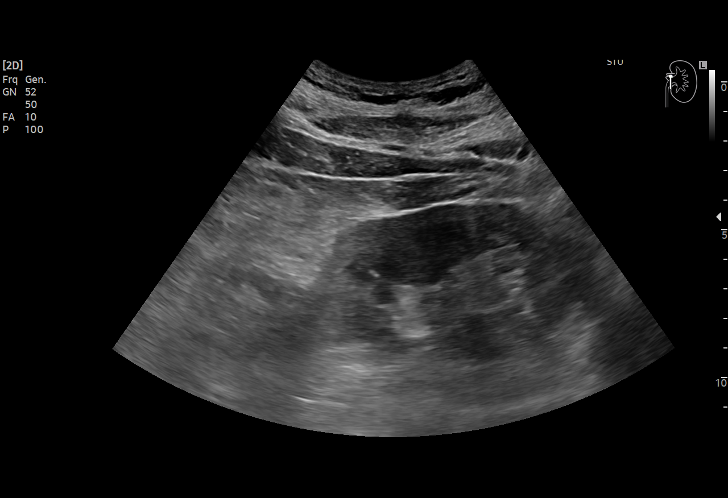
[im 47/80]
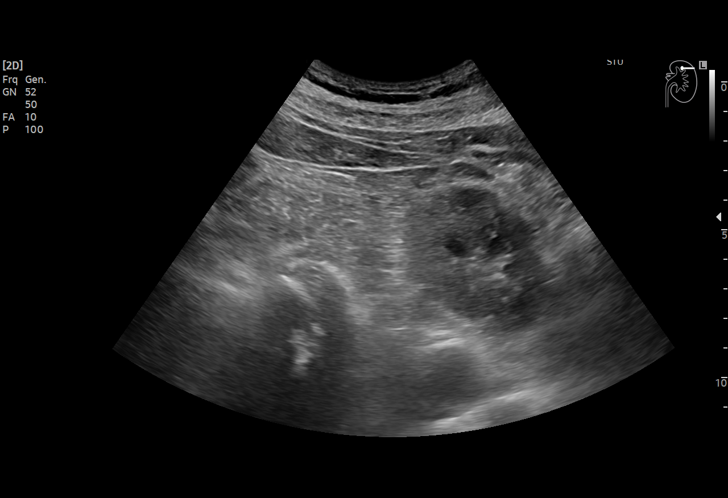
[im 50/80]
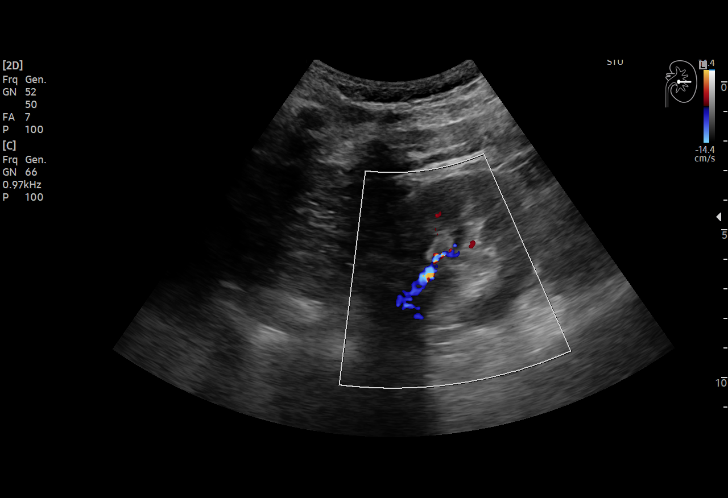
[im 56/80]
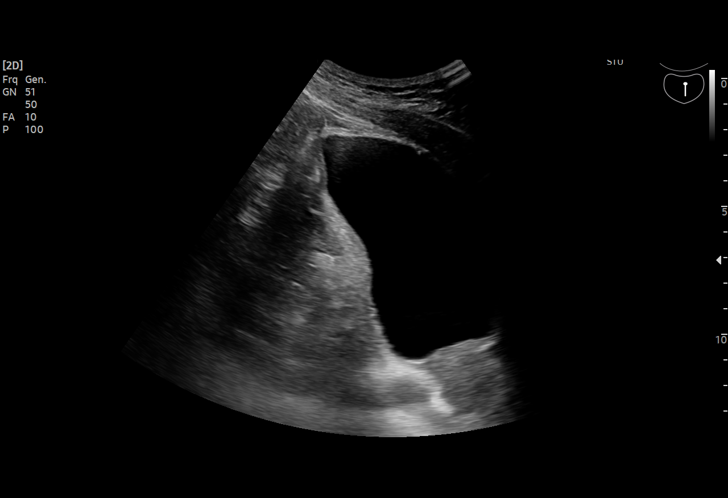
[im 63/80]
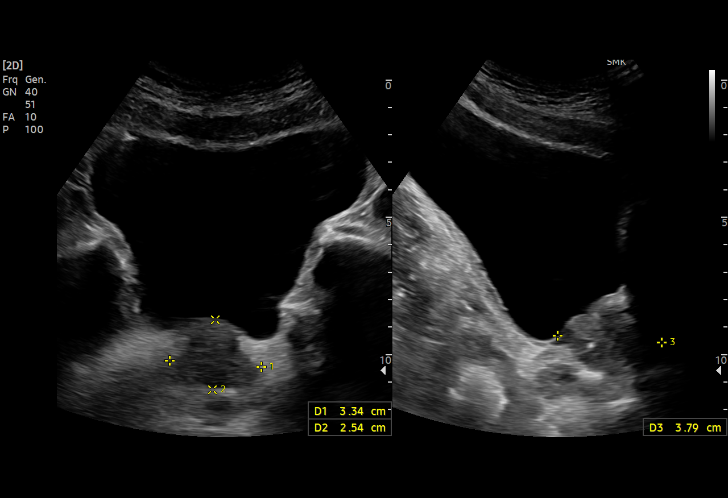
[im 66/80]
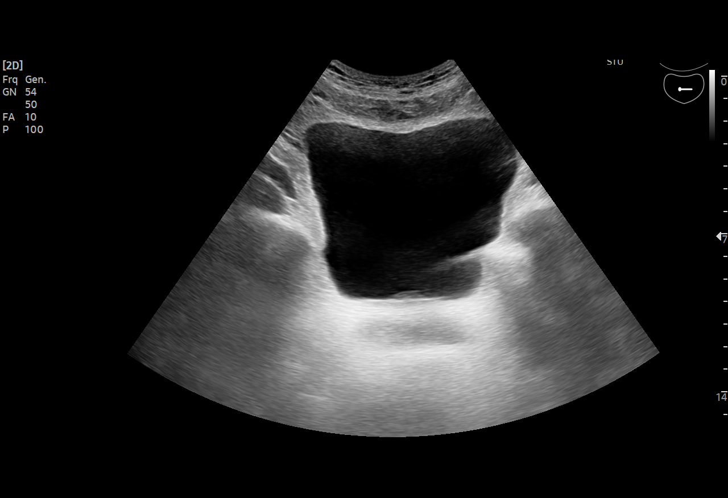
[im 73/80]
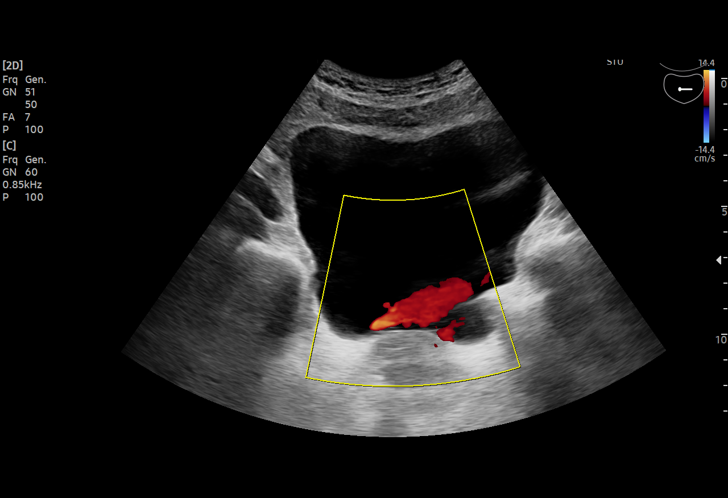
[im 80/80]
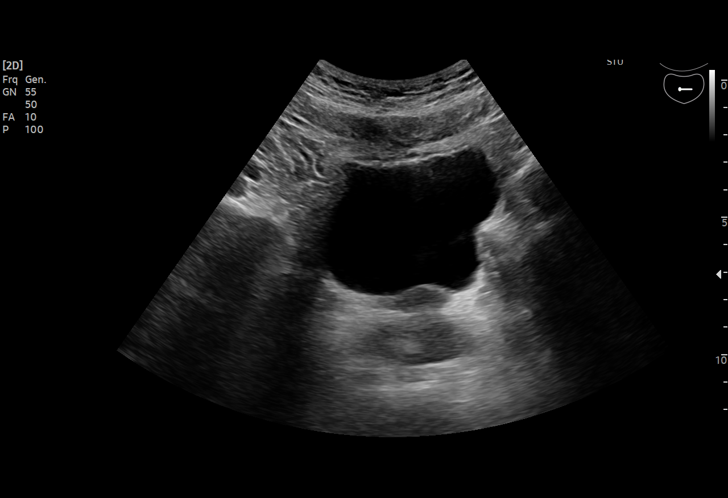

[15 of 25 positions shown; findings below may reference images not displayed]

FINDINGS: Right Kidney:

Renal measurements: 12.7 x 4.8 x 5.8 cm = volume: 185 mL.
Echogenicity within normal limits. No mass or hydronephrosis
visualized.

Left Kidney:

Renal measurements: 12.2 x 5.4 x 4.4 cm = volume: 149 mL.
Echogenicity within normal limits. No mass or hydronephrosis
visualized.

Bladder:

Appears normal for degree of bladder distention.

Other:

None.
IMPRESSION: Normal study.  No cause for the patient's symptoms identified.

## 2022-10-29 ENCOUNTER — Encounter: Payer: Self-pay | Admitting: *Deleted

## 2022-10-29 DIAGNOSIS — R918 Other nonspecific abnormal finding of lung field: Secondary | ICD-10-CM

## 2022-10-29 NOTE — Progress Notes (Signed)
Referral received for patient to follow up regarding abnormal LDCT. New pt appt scheduled. Pt and referring provider made aware. Pt informed of visitor and masking policy. Contact info given and instructed to call with any questions or needs. Pt verbalized understanding.

## 2022-10-30 ENCOUNTER — Ambulatory Visit
Admission: RE | Admit: 2022-10-30 | Discharge: 2022-10-30 | Disposition: A | Discharge status: Home / Self Care | Payer: Self-pay | Source: Ambulatory Visit | Attending: Internal Medicine | Admitting: Internal Medicine

## 2022-10-30 ENCOUNTER — Other Ambulatory Visit: Payer: Medicare HMO

## 2022-10-30 DIAGNOSIS — R918 Other nonspecific abnormal finding of lung field: Secondary | ICD-10-CM

## 2022-10-30 NOTE — Addendum Note (Signed)
Addended by: Telford Nab on: 10/30/2022 01:18 PM   Modules accepted: Orders

## 2022-10-30 NOTE — Progress Notes (Signed)
Outside images received from [facility]. Images uploaded to PACS.

## 2022-10-31 ENCOUNTER — Ambulatory Visit
Admission: RE | Admit: 2022-10-31 | Discharge: 2022-10-31 | Disposition: A | Discharge status: Home / Self Care | Payer: Medicare PPO | Source: Ambulatory Visit | Attending: Internal Medicine | Admitting: Internal Medicine

## 2022-10-31 ENCOUNTER — Encounter: Payer: Self-pay | Admitting: Internal Medicine

## 2022-10-31 ENCOUNTER — Inpatient Hospital Stay: Payer: Medicare PPO

## 2022-10-31 ENCOUNTER — Inpatient Hospital Stay: Payer: Medicare PPO | Attending: Internal Medicine | Admitting: Internal Medicine

## 2022-10-31 ENCOUNTER — Encounter: Payer: Self-pay | Admitting: *Deleted

## 2022-10-31 VITALS — BP 153/95 | HR 76 | Temp 97.4°F | Resp 18 | Ht 71.0 in | Wt 184.1 lb

## 2022-10-31 DIAGNOSIS — R918 Other nonspecific abnormal finding of lung field: Secondary | ICD-10-CM

## 2022-10-31 DIAGNOSIS — Z7952 Long term (current) use of systemic steroids: Secondary | ICD-10-CM | POA: Diagnosis not present

## 2022-10-31 DIAGNOSIS — J449 Chronic obstructive pulmonary disease, unspecified: Secondary | ICD-10-CM | POA: Insufficient documentation

## 2022-10-31 DIAGNOSIS — E279 Disorder of adrenal gland, unspecified: Secondary | ICD-10-CM | POA: Insufficient documentation

## 2022-10-31 DIAGNOSIS — Z79899 Other long term (current) drug therapy: Secondary | ICD-10-CM | POA: Insufficient documentation

## 2022-10-31 DIAGNOSIS — I10 Essential (primary) hypertension: Secondary | ICD-10-CM | POA: Diagnosis not present

## 2022-10-31 DIAGNOSIS — R059 Cough, unspecified: Secondary | ICD-10-CM | POA: Insufficient documentation

## 2022-10-31 DIAGNOSIS — F17210 Nicotine dependence, cigarettes, uncomplicated: Secondary | ICD-10-CM | POA: Insufficient documentation

## 2022-10-31 LAB — COMPREHENSIVE METABOLIC PANEL
ALT: 30 U/L (ref 0–44)
AST: 30 U/L (ref 15–41)
Albumin: 3.8 g/dL (ref 3.5–5.0)
Alkaline Phosphatase: 78 U/L (ref 38–126)
Anion gap: 10 (ref 5–15)
BUN: 14 mg/dL (ref 8–23)
CO2: 24 mmol/L (ref 22–32)
Calcium: 9.2 mg/dL (ref 8.9–10.3)
Chloride: 104 mmol/L (ref 98–111)
Creatinine, Ser: 1.25 mg/dL — ABNORMAL HIGH (ref 0.61–1.24)
GFR, Estimated: >60 mL/min (ref >60)
Glucose, Bld: 91 mg/dL (ref 70–99)
Potassium: 4.2 mmol/L (ref 3.5–5.1)
Sodium: 138 mmol/L (ref 135–145)
Total Bilirubin: 0.3 mg/dL (ref 0.3–1.2)
Total Protein: 7.6 g/dL (ref 6.5–8.1)

## 2022-10-31 LAB — CBC WITH DIFFERENTIAL/PLATELET
Abs Immature Granulocytes: 0.12 10*3/uL — ABNORMAL HIGH (ref 0.00–0.07)
Basophils Absolute: 0 10*3/uL (ref 0.0–0.1)
Basophils Relative: 0 %
Eosinophils Absolute: 0 10*3/uL (ref 0.0–0.5)
Eosinophils Relative: 0 %
HCT: 41.5 % (ref 39.0–52.0)
Hemoglobin: 13.9 g/dL (ref 13.0–17.0)
Immature Granulocytes: 1 %
Lymphocytes Relative: 33 %
Lymphs Abs: 3.8 10*3/uL (ref 0.7–4.0)
MCH: 31.7 pg (ref 26.0–34.0)
MCHC: 33.5 g/dL (ref 30.0–36.0)
MCV: 94.5 fL (ref 80.0–100.0)
Monocytes Absolute: 1 10*3/uL (ref 0.1–1.0)
Monocytes Relative: 9 %
Neutro Abs: 6.5 10*3/uL (ref 1.7–7.7)
Neutrophils Relative %: 57 %
Platelets: 345 10*3/uL (ref 150–400)
RBC: 4.39 MIL/uL (ref 4.22–5.81)
RDW: 13.6 % (ref 11.5–15.5)
WBC: 11.5 10*3/uL — ABNORMAL HIGH (ref 4.0–10.5)
nRBC: 0 % (ref 0.0–0.2)

## 2022-10-31 LAB — TSH: TSH: 2.048 u[IU]/mL (ref 0.350–4.500)

## 2022-10-31 LAB — LACTATE DEHYDROGENASE: LDH: 185 U/L (ref 98–192)

## 2022-10-31 MED ORDER — GADOBUTROL 1 MMOL/ML IV SOLN
7.5000 mL | Freq: Once | INTRAVENOUS | Status: AC | PRN
  Administered 2022-10-31: 7.5 mL via INTRAVENOUS

## 2022-10-31 MED ORDER — BENZONATATE 100 MG PO CAPS: 100.0000 mg | ORAL_CAPSULE | Freq: Three times a day (TID) | ORAL | 0 refills | Status: DC | PRN

## 2022-10-31 NOTE — Assessment & Plan Note (Addendum)
#  JAN 2024- CT-noncontrast lung cancer screening - approximately 4.5 cm left lower hilar mass; involving the mediastinum; multiple lesions; also adrenal lesion.  I reviewed the imaging with the patient and his wife in detail.  # Recommend PET scan for further evaluation/bone lesions. Patient will need biopsy to confirm the malignancy/NGS testing.  Discussed based on imaging this is concerning for stage IV lung cancer.  Unfortunately the stage IV cancers are incurable.  The median survival is average in the between 1 to 2 years. Patient also need brain MRI-given the risk of metastasis to brain.  # After the lengthy discussion patient-patient is reluctant with biopsy.  Patient is leaning towards homeopathic options.   # Clinical trials: Hold off for now.  Consider next visit.  # Smoking: Active smoker; recommend quitting/cutting down.  # COPD/cough-currently on prednisone.  As per PCP.  Recommend compliance with inhalers.  Also recommend Tessalon Perles.  New prescription sent.  Thank you Ms.Lavena Bullion for allowing me to participate in the care of your pleasant patient. Please do not hesitate to contact me with questions or concerns in the interim.  Discussed with [facility].   # DISPOSITION: # PET scan ASAP # MRI Brain ASAP # labs- cbc/cmp; LDH; TSH # follow up in 1-2 days after the PET scans- Dr.B  # I reviewed the blood work- with the patient in detail; also reviewed the imaging independently [as summarized above]; and with the patient in detail.
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
## 2022-10-31 NOTE — Progress Notes (Signed)
Met with patient and his wife during initial consult with Dr. Rogue Bussing to discuss recent CT findings. All questions answered during visit. Reviewed upcoming appts for further imaging and follow up with Dr. Vicente Masson info given and instructed to call with any questions or needs. Pt verbalized understanding.

## 2022-10-31 NOTE — Progress Notes (Signed)
Currently being treated by urologist for prostate infection and has 2 weeks left of 90 day Bactrim.  No hematuria at this time.    Has 2 days left of Prednisone for breathing/SOBr.  Still having cough.  New left hip/leg pain, 1/10 pain scale  BP 153/95-has not taken BP med today.

## 2022-11-03 ENCOUNTER — Encounter: Payer: Self-pay | Admitting: Internal Medicine

## 2022-11-03 ENCOUNTER — Encounter: Payer: Self-pay | Admitting: *Deleted

## 2022-11-03 NOTE — Progress Notes (Signed)
Per Dr. Rogue Bussing - Please call the patient with results-the 81mm spot on the brain-is unclear if this is cancerous tumor versus benign. This should not cause any symptoms. The spot in neck bone-suspicious for cancer. However, will await PET scan as ordered. Also will discuss the scans at the next visit.   Pt called with results and answered all his questions over the phone. Reminded of upcoming appts for PET scan and follow up. Pt stated that he is interested in seeking opinion from holistic medicine which is scheduled in [place] in Feb. States that he will be out of town 2/19-2/23 and is considering seeking holistic approach before pursuing any invasive procedures. Informed pt that will further discuss with Dr. B at his appt on 2/7. Pt verbalized understanding. Nothing further needed at this time.

## 2022-11-03 NOTE — Progress Notes (Signed)
I spoke to the patient regarding the results of the brain MRI. Plan follow-up imaging in three months. PET-  CT pending

## 2022-11-06 ENCOUNTER — Ambulatory Visit: Payer: Medicare PPO | Admitting: Urology

## 2022-11-07 ENCOUNTER — Ambulatory Visit
Admission: RE | Admit: 2022-11-07 | Discharge: 2022-11-07 | Disposition: A | Discharge status: Home / Self Care | Payer: Medicare PPO | Source: Ambulatory Visit | Attending: Internal Medicine | Admitting: Internal Medicine

## 2022-11-07 DIAGNOSIS — R918 Other nonspecific abnormal finding of lung field: Secondary | ICD-10-CM

## 2022-11-07 DIAGNOSIS — R911 Solitary pulmonary nodule: Secondary | ICD-10-CM | POA: Diagnosis not present

## 2022-11-07 DIAGNOSIS — C787 Secondary malignant neoplasm of liver and intrahepatic bile duct: Secondary | ICD-10-CM | POA: Diagnosis not present

## 2022-11-07 DIAGNOSIS — C7951 Secondary malignant neoplasm of bone: Secondary | ICD-10-CM | POA: Diagnosis not present

## 2022-11-07 LAB — GLUCOSE, CAPILLARY: Glucose-Capillary: 93 mg/dL (ref 70–99)

## 2022-11-07 MED ORDER — FLUDEOXYGLUCOSE F - 18 (FDG) INJECTION
10.2600 | Freq: Once | INTRAVENOUS | Status: AC
  Administered 2022-11-07: 10.26 via INTRAVENOUS

## 2022-11-12 ENCOUNTER — Encounter: Payer: Self-pay | Admitting: *Deleted

## 2022-11-12 ENCOUNTER — Inpatient Hospital Stay: Payer: Medicare PPO | Attending: Internal Medicine | Admitting: Internal Medicine

## 2022-11-12 DIAGNOSIS — N2889 Other specified disorders of kidney and ureter: Secondary | ICD-10-CM | POA: Diagnosis not present

## 2022-11-12 DIAGNOSIS — Z79899 Other long term (current) drug therapy: Secondary | ICD-10-CM | POA: Diagnosis not present

## 2022-11-12 DIAGNOSIS — J449 Chronic obstructive pulmonary disease, unspecified: Secondary | ICD-10-CM | POA: Insufficient documentation

## 2022-11-12 DIAGNOSIS — E279 Disorder of adrenal gland, unspecified: Secondary | ICD-10-CM | POA: Diagnosis not present

## 2022-11-12 DIAGNOSIS — K7689 Other specified diseases of liver: Secondary | ICD-10-CM | POA: Diagnosis present

## 2022-11-12 DIAGNOSIS — F17210 Nicotine dependence, cigarettes, uncomplicated: Secondary | ICD-10-CM | POA: Insufficient documentation

## 2022-11-12 DIAGNOSIS — R918 Other nonspecific abnormal finding of lung field: Secondary | ICD-10-CM | POA: Diagnosis present

## 2022-11-12 DIAGNOSIS — Z8042 Family history of malignant neoplasm of prostate: Secondary | ICD-10-CM | POA: Diagnosis not present

## 2022-11-12 DIAGNOSIS — Z7952 Long term (current) use of systemic steroids: Secondary | ICD-10-CM | POA: Insufficient documentation

## 2022-11-12 DIAGNOSIS — Z801 Family history of malignant neoplasm of trachea, bronchus and lung: Secondary | ICD-10-CM | POA: Insufficient documentation

## 2022-11-12 DIAGNOSIS — Z803 Family history of malignant neoplasm of breast: Secondary | ICD-10-CM | POA: Diagnosis not present

## 2022-11-12 DIAGNOSIS — R160 Hepatomegaly, not elsewhere classified: Secondary | ICD-10-CM

## 2022-11-12 MED ORDER — BENZONATATE 100 MG PO CAPS: 100.0000 mg | ORAL_CAPSULE | Freq: Three times a day (TID) | ORAL | 1 refills | Status: DC | PRN

## 2022-11-12 NOTE — Progress Notes (Signed)
Met with patient during follow up visit with Dr. Rogue Bussing. All questions answered during visit. Informed pt that will schedule him for biopsy the week he returns from [place] (week of 2/26) and will be called with that appt. Informed pt that if does not wish to proceed with biopsy then to let us know to cancel. Nothing further needed at this time. Instructed pt to call with any questions or needs. Pt verbalized understanding.

## 2022-11-12 NOTE — Assessment & Plan Note (Addendum)
#   JAN 2024- CT-noncontrast lung cancer screening - approximately 4.5 cm left lower hilar mass; involving the mediastinum; multiple lesions; also adrenal lesion. PET 2nd FEB 2024-  4.9 cm mass in the left perihilar upper lobe obstructing the apical  left upper lobe bronchi with direct mediastinal invasion and left hilar  and aortopulmonary lymphadenopathy; Bilateral adrenal nodules and masses, multiple hypoattenuating lesions  in the liver and right renal nodule concerning for metastatic disease. Pleural nodules in the posterior left pleural space measuring up to 1.5  cm compatible with pleural metastases.  # I discussed with the patient the importance to proceed with chemotherapy as soon as possible given the bulky disease/disease burden.  However patient wants to seek a second opinion "homeopathic doctor" in [place].  Discussed with radiology-plan biopsy of the liver lesion.  Biopsy ordered/scheduled in about 3 weeks or so after the visit from [place].  # JAN 2024-MRI brain with and without contrast- 4 mm right parafalcine enhancing lesion favored to reflect a small meningioma; however, given the history and absence of prior studies for comparison, a dural-based metastatic lesion can not be entirely excluded. 1.2 cm focus of signal abnormality in the right aspect of the clivus suspicious for osseous metastatic disease.  # Smoking: Active smoker; recommend quitting/cutting down.  # Pain-chronic right sciatic pain not any worse.  Denies any worsening rib pain.  Continue Tylenol as needed.  # COPD/cough- continue with inhalers.  Also recommend Tessalon Perles.  New refill prescription sent.  # Given the history of likely malignancy; and also upcoming long distance travel to [place] -I discussed the importance of close monitoring of symptoms of blood clots which include but not limited to-sudden chest pain/shortness of breath or swelling of the extremities.  Also counseled the patient regarding importance  of keeping hydrated/moving during long car rides a  and other periods of immobility.   Discussed with [facility]; will schedule biopsy pending patient's plan/return from [place].  # DISPOSITION: # follow up TBD- Dr.B  # I reviewed the blood work- with the patient in detail; also reviewed the imaging independently [as summarized above]; and with the patient in detail.   # 40 minutes face-to-face with the patient discussing the above plan of care; more than 50% of time spent on prognosis/ natural history; counseling and coordination.
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
## 2022-11-12 NOTE — Progress Notes (Signed)
Still having a cough with new left side soreness for past 2 weeks.  Still taking abx prescribed by urologist for blood clots in urine and has not noticed any more clots for past few weeks.  5 lb wt loss despite having a good appetite.  He has changed his eating habit at night due to new acid reflux with lying down after eating.

## 2022-11-13 NOTE — Addendum Note (Signed)
Addended by: Telford Nab on: 11/13/2022 08:23 AM   Modules accepted: Orders

## 2022-11-18 ENCOUNTER — Other Ambulatory Visit: Payer: Self-pay | Admitting: Internal Medicine

## 2022-11-26 ENCOUNTER — Ambulatory Visit: Payer: Medicare HMO | Admitting: Urology
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
## 2022-12-01 ENCOUNTER — Other Ambulatory Visit: Payer: Self-pay | Admitting: Student

## 2022-12-01 ENCOUNTER — Other Ambulatory Visit: Payer: Self-pay | Admitting: *Deleted

## 2022-12-01 DIAGNOSIS — Z9189 Other specified personal risk factors, not elsewhere classified: Secondary | ICD-10-CM

## 2022-12-01 DIAGNOSIS — R160 Hepatomegaly, not elsewhere classified: Secondary | ICD-10-CM

## 2022-12-01 MED ORDER — BENZONATATE 100 MG PO CAPS: 100.0000 mg | ORAL_CAPSULE | Freq: Three times a day (TID) | ORAL | 1 refills | Status: DC | PRN

## 2022-12-02 ENCOUNTER — Ambulatory Visit
Admission: RE | Admit: 2022-12-02 | Discharge: 2022-12-02 | Disposition: A | Discharge status: Home / Self Care | Payer: Medicare PPO | Source: Ambulatory Visit | Attending: Internal Medicine | Admitting: Internal Medicine

## 2022-12-02 DIAGNOSIS — R918 Other nonspecific abnormal finding of lung field: Secondary | ICD-10-CM | POA: Diagnosis present

## 2022-12-02 DIAGNOSIS — R160 Hepatomegaly, not elsewhere classified: Secondary | ICD-10-CM | POA: Diagnosis not present

## 2022-12-02 DIAGNOSIS — K769 Liver disease, unspecified: Secondary | ICD-10-CM | POA: Insufficient documentation

## 2022-12-02 DIAGNOSIS — Z9189 Other specified personal risk factors, not elsewhere classified: Secondary | ICD-10-CM | POA: Insufficient documentation

## 2022-12-02 DIAGNOSIS — C7800 Secondary malignant neoplasm of unspecified lung: Secondary | ICD-10-CM | POA: Insufficient documentation

## 2022-12-02 DIAGNOSIS — I10 Essential (primary) hypertension: Secondary | ICD-10-CM | POA: Insufficient documentation

## 2022-12-02 DIAGNOSIS — F419 Anxiety disorder, unspecified: Secondary | ICD-10-CM | POA: Diagnosis not present

## 2022-12-02 LAB — CBC
HCT: 39.6 % (ref 39.0–52.0)
Hemoglobin: 13.3 g/dL (ref 13.0–17.0)
MCH: 31.6 pg (ref 26.0–34.0)
MCHC: 33.6 g/dL (ref 30.0–36.0)
MCV: 94.1 fL (ref 80.0–100.0)
Platelets: 331 10*3/uL (ref 150–400)
RBC: 4.21 MIL/uL — ABNORMAL LOW (ref 4.22–5.81)
RDW: 13.6 % (ref 11.5–15.5)
WBC: 7.7 10*3/uL (ref 4.0–10.5)
nRBC: 0 % (ref 0.0–0.2)

## 2022-12-02 LAB — COMPREHENSIVE METABOLIC PANEL
ALT: 26 U/L (ref 0–44)
AST: 28 U/L (ref 15–41)
Albumin: 3.7 g/dL (ref 3.5–5.0)
Alkaline Phosphatase: 110 U/L (ref 38–126)
Anion gap: 9 (ref 5–15)
BUN: 12 mg/dL (ref 8–23)
CO2: 24 mmol/L (ref 22–32)
Calcium: 9.1 mg/dL (ref 8.9–10.3)
Chloride: 106 mmol/L (ref 98–111)
Creatinine, Ser: 1.04 mg/dL (ref 0.61–1.24)
GFR, Estimated: >60 mL/min (ref >60)
Glucose, Bld: 97 mg/dL (ref 70–99)
Potassium: 3.6 mmol/L (ref 3.5–5.1)
Sodium: 139 mmol/L (ref 135–145)
Total Bilirubin: 0.5 mg/dL (ref 0.3–1.2)
Total Protein: 7.3 g/dL (ref 6.5–8.1)

## 2022-12-02 LAB — PROTIME-INR
INR: 1.1 (ref 0.8–1.2)
Prothrombin Time: 13.7 seconds (ref 11.4–15.2)

## 2022-12-02 MED ORDER — FENTANYL CITRATE (PF) 100 MCG/2ML IJ SOLN
INTRAMUSCULAR | Status: AC | PRN
  Administered 2022-12-02 (×2): 50 ug via INTRAVENOUS

## 2022-12-02 MED ORDER — LIDOCAINE HCL (PF) 1 % IJ SOLN
10.0000 mL | Freq: Once | INTRAMUSCULAR | Status: AC
  Administered 2022-12-02: 10 mL via INTRADERMAL
  Filled 2022-12-02: qty 10

## 2022-12-02 MED ORDER — FENTANYL CITRATE (PF) 100 MCG/2ML IJ SOLN
INTRAMUSCULAR | Status: AC
  Filled 2022-12-02: qty 2

## 2022-12-02 MED ORDER — SODIUM CHLORIDE 0.9 % IV SOLN: INTRAVENOUS | Status: DC

## 2022-12-02 MED ORDER — MIDAZOLAM HCL 2 MG/2ML IJ SOLN
INTRAMUSCULAR | Status: AC | PRN
  Administered 2022-12-02 (×2): 1 mg via INTRAVENOUS

## 2022-12-02 MED ORDER — MIDAZOLAM HCL 2 MG/2ML IJ SOLN
INTRAMUSCULAR | Status: AC
  Filled 2022-12-02: qty 2

## 2022-12-02 NOTE — Procedures (Signed)
Interventional Radiology Procedure Note  Procedure: Ultrasound guided liver mass biopsy  Findings: Please refer to procedural dictation for full description.18 ga core from left lobe mass x2.  Gelfoam slurry needle track embolization.  Complications: None immediate  Estimated Blood Loss: < 5 ml  Recommendations: Strict 3 hour bedrest Follow up Pathology results   Ruthann Cancer, MD

## 2022-12-02 NOTE — Progress Notes (Signed)
Family at bedside, pt drinking another ginger ale with sandwich tray

## 2022-12-02 NOTE — Discharge Instructions (Signed)
Needle Biopsy, Care After These instructions tell you how to care for yourself after your procedure. Your doctor may also give you more specific instructions. Call your doctor if you have any problems or questions. What can I expect after the procedure? After the procedure, it is common to have: Soreness. Bruising. Mild pain. Follow these instructions at home:  Return to your normal activities tomorrow. Take over-the-counter and prescription medicines only as told by your doctor. Wash your hands with soap and water before you change your bandage (dressing). If you cannot use soap and water, use hand sanitizer. You may remove your bandage in 2 days Check your puncture site every day for signs of infection. Watch for: Redness, swelling, or pain. Fluid or blood.  Pus or a bad smell. Warmth. You can shower tomorrow Keep all follow-up visits as told by your doctor. This is important. Contact a doctor if you have: A fever. Redness, swelling, or pain at the puncture site, and it lasts longer than a few days. Fluid, blood, or pus coming from the puncture site. Warmth coming from the puncture site. Get help right away if: You have a lot of bleeding from the puncture site. Summary After the procedure, it is common to have soreness, bruising, or mild pain at the puncture site. Check your puncture site every day for signs of infection, such as redness, swelling, or pain. Get help right away if you have severe bleeding from your puncture site. This information is not intended to replace advice given to you by your health care provider. Make sure you discuss any questions you have with your health care provider. Document Revised: 10/05/2017 Document Reviewed: 10/05/2017 Elsevier Patient Education  2020 [employer].

## 2022-12-02 NOTE — Progress Notes (Signed)
Patient clinically stable post Liver biopsy per Dr Serafina Royals, tolerated well. Vitals stable pre and post procedure. Received Versed 2 mg along with Fentanyl 100 mcg IV for procedure. Report given to Marquette Old RN post procedure /331, family brought to bedside with update given.

## 2022-12-04 LAB — SURGICAL PATHOLOGY

## 2022-12-09 ENCOUNTER — Encounter: Payer: Self-pay | Admitting: Internal Medicine

## 2022-12-09 ENCOUNTER — Encounter: Payer: Self-pay | Admitting: *Deleted

## 2022-12-09 ENCOUNTER — Inpatient Hospital Stay: Payer: Medicare PPO | Attending: Internal Medicine | Admitting: Internal Medicine

## 2022-12-09 VITALS — BP 135/90 | HR 81 | Temp 97.9°F | Resp 18 | Wt 182.7 lb

## 2022-12-09 DIAGNOSIS — R918 Other nonspecific abnormal finding of lung field: Secondary | ICD-10-CM

## 2022-12-09 DIAGNOSIS — G939 Disorder of brain, unspecified: Secondary | ICD-10-CM | POA: Diagnosis not present

## 2022-12-09 DIAGNOSIS — Z79899 Other long term (current) drug therapy: Secondary | ICD-10-CM | POA: Diagnosis not present

## 2022-12-09 DIAGNOSIS — Z5111 Encounter for antineoplastic chemotherapy: Secondary | ICD-10-CM | POA: Diagnosis present

## 2022-12-09 DIAGNOSIS — F17210 Nicotine dependence, cigarettes, uncomplicated: Secondary | ICD-10-CM | POA: Diagnosis not present

## 2022-12-09 DIAGNOSIS — R590 Localized enlarged lymph nodes: Secondary | ICD-10-CM | POA: Insufficient documentation

## 2022-12-09 DIAGNOSIS — C787 Secondary malignant neoplasm of liver and intrahepatic bile duct: Secondary | ICD-10-CM | POA: Insufficient documentation

## 2022-12-09 DIAGNOSIS — C3412 Malignant neoplasm of upper lobe, left bronchus or lung: Secondary | ICD-10-CM

## 2022-12-09 DIAGNOSIS — C7951 Secondary malignant neoplasm of bone: Secondary | ICD-10-CM | POA: Diagnosis not present

## 2022-12-09 MED ORDER — ONDANSETRON HCL 8 MG PO TABS: ORAL_TABLET | ORAL | 1 refills | Status: DC

## 2022-12-09 MED ORDER — PROCHLORPERAZINE MALEATE 10 MG PO TABS: 10.0000 mg | ORAL_TABLET | Freq: Four times a day (QID) | ORAL | 1 refills | Status: DC | PRN

## 2022-12-09 MED ORDER — HYDROCOD POLI-CHLORPHE POLI ER 10-8 MG/5ML PO SUER: 5.0000 mL | Freq: Every evening | ORAL | 0 refills | Status: DC | PRN

## 2022-12-09 MED ORDER — LIDOCAINE-PRILOCAINE 2.5-2.5 % EX CREA: TOPICAL_CREAM | CUTANEOUS | 3 refills | Status: DC
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
## 2022-12-09 NOTE — Progress Notes (Signed)
Met with patient during follow up visit with Dr. Rogue Bussing. All questions answered during visit. Reviewed upcoming appts. Informed that will call him with his appt for port placement once scheduled. Reassurance provided during visit. Instructed to call with any questions or needs. Pt verbalized understanding.

## 2022-12-09 NOTE — Progress Notes (Signed)
START ON PATHWAY REGIMEN - Small Cell Lung     Cycles 1 through 4, every 21 days:     Atezolizumab      Carboplatin      Etoposide    Cycles 5 and beyond, every 21 days:     Atezolizumab   **Always confirm dose/schedule in your pharmacy ordering system**  Patient Characteristics: Newly Diagnosed, Preoperative or Nonsurgical Candidate (Clinical Staging), First Line, Extensive Stage Therapeutic Status: Newly Diagnosed, Preoperative or Nonsurgical Candidate (Clinical Staging) AJCC T Category: cT2b AJCC N Category: cN2 AJCC M Category: pM1c AJCC 8 Stage Grouping: IVB Stage Classification: Extensive Intent of Therapy: Non-Curative / Palliative Intent, Discussed with Patient 

## 2022-12-09 NOTE — Assessment & Plan Note (Addendum)
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 PET 2nd FEB 2024-  4.9 cm mass in the left perihilar upper lobe obstructing the apical  left upper lobe bronchi with direct mediastinal invasion and left hilar  and aortopulmonary lymphadenopathy; Bilateral adrenal nodules and masses, multiple hypoattenuating lesions  in the liver and right renal nodule concerning for metastatic disease. Pleural nodules in the posterior left pleural space measuring up to 1.5  cm compatible with pleural metastases.  #Discussed chemo immunotherapy for extensive stage small cell lung cancer includes carbo etoposide-Tecentriq every 3 weeks x 4 cycles.  Response would be evaluated with interim imaging.  We will also recommend maintenance Tecentriq post chemotherapy.  I discussed the schedule in detail.  Discussed treatments are palliative not curative.   Discussed the potential side effects including but not limited to-increasing fatigue, nausea vomiting, diarrhea, hair loss, sores in the mouth, increase risk of infection and also neuropathy.    I discussed the mechanism of action; The goal of therapy is palliative; and length of treatments are likely ongoing/based upon the results of the scans. Discussed the potential side effects of immunotherapy including but not limited to diarrhea; skin rash; elevated LFTs/endocrine abnormalities etc.   # JAN 2024-MRI brain with and without contrast- 4 mm right parafalcine enhancing lesion favored to reflect a small meningioma; however, given the history and absence of prior studies for comparison, a dural-based metastatic lesion can not be entirely excluded. 1.2 cm focus of signal abnormality in the right aspect of the clivus suspicious for osseous metastatic disease.  Will repeat MRI brain in 2 months or so.  # Intermittent hematuria- ? Etiology [Dr.Snininski]-status post antibiotics monitor for now.  # Smoking: Active smoker; recommend  quitting/cutting down.  # Pain-chronic right sciatic pain not any worse.  Denies any worsening rib pain.  Continue Tylenol as needed.   # COPD/cough- continue with inhalers.  Not controlled on Tessalon Perles.  Recommend Tussionex prn.  Prescription sent.  #  Antiemetics-Zofran and Compazine; EMLA cream sent to pharmacy.  Referral to IR for port placement.  ? zometa # DISPOSITION: # Chemotherapy education- carbo-Etop-tecentriq # port placement # follow up  3/18; MD; labs- cbc/cmp; LDH; TSH; carbo-Etop-tecentriq [new]; d-2 &3- Etop;d-4-undeyca- Dr.B

## 2022-12-09 NOTE — Progress Notes (Signed)
Patient is concerned with constant coughing fits that come and go through out the day, he is having trouble sleeping and very depressed. Doesn't feel the tessalon pearls are helping. He did express some pain in the left hip and leg.

## 2022-12-10 ENCOUNTER — Encounter: Payer: Self-pay | Admitting: Internal Medicine

## 2022-12-10 ENCOUNTER — Other Ambulatory Visit: Payer: Self-pay

## 2022-12-10 ENCOUNTER — Inpatient Hospital Stay (HOSPITAL_BASED_OUTPATIENT_CLINIC_OR_DEPARTMENT_OTHER): Payer: Medicare PPO | Admitting: Hospice and Palliative Medicine

## 2022-12-10 DIAGNOSIS — C3412 Malignant neoplasm of upper lobe, left bronchus or lung: Secondary | ICD-10-CM

## 2022-12-10 NOTE — Progress Notes (Signed)
Multidisciplinary Oncology Council Documentation  Ersel Kleeman. was presented by our [facility] on 12/10/2022, which included representatives from:  Palliative Care Dietitian  Physical/Occupational Therapist Nurse Navigator Genetics Speech Therapist Social work Survivorship RN Financial Navigator Research RN   Reagan currently presents with history of SCLC  We reviewed previous medical and familial history, history of present illness, and recent lab results along with all available histopathologic and imaging studies. The [place] considered available treatment options and made the following recommendations/referrals:  Nutrition, SW, PC, rehab screening  The MOC is a meeting of clinicians from various specialty areas who evaluate and discuss patients for whom a multidisciplinary approach is being considered. Final determinations in the plan of care are those of the provider(s).   Today's extended care, comprehensive team conference, [place] was not present for the discussion and was not examined.

## 2022-12-11 ENCOUNTER — Encounter: Payer: Self-pay | Admitting: Licensed Clinical Social Worker

## 2022-12-11 ENCOUNTER — Inpatient Hospital Stay: Payer: Medicare PPO

## 2022-12-11 NOTE — Progress Notes (Signed)
[facility] Clinical Social Work  Clinical Social Work was referred by medical provider for assessment of psychosocial needs.  Clinical Social Worker contacted patient by phone  to offer support and assess for needs.  CSW left voicemail with contact information and request for return call.   FA  Sherrill Buikema, LCSW  Clinical Social Worker [facility]          

## 2022-12-12 NOTE — Progress Notes (Signed)
Pharmacist Chemotherapy Monitoring - Initial Assessment    Anticipated start date: 12/22/22   The following has been reviewed per standard work regarding the patient's treatment regimen: The patient's diagnosis, treatment plan and drug doses, and organ/hematologic function Lab orders and baseline tests specific to treatment regimen  The treatment plan start date, drug sequencing, and pre-medications Prior authorization status  Patient's documented medication list, including drug-drug interaction screen and prescriptions for anti-emetics and supportive care specific to the treatment regimen The drug concentrations, fluid compatibility, administration routes, and timing of the medications to be used The patient's access for treatment and lifetime cumulative dose history, if applicable  The patient's medication allergies and previous infusion related reactions, if applicable   Changes made to treatment plan:  N/A  Follow up needed:  [place], [facility], 12/12/2022  9:33 AM

## 2022-12-15 ENCOUNTER — Inpatient Hospital Stay: Payer: Medicare PPO
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
## 2022-12-17 ENCOUNTER — Other Ambulatory Visit: Payer: Self-pay | Admitting: Student

## 2022-12-17 ENCOUNTER — Inpatient Hospital Stay: Payer: Medicare PPO | Admitting: Occupational Therapy

## 2022-12-17 DIAGNOSIS — C3412 Malignant neoplasm of upper lobe, left bronchus or lung: Secondary | ICD-10-CM

## 2022-12-17 NOTE — Progress Notes (Signed)
Patient for IR Port Insertion on Thurs 12/18/2022, I called and spoke with the patient on the phone and gave pre-procedure instructions. Pt was made aware to be here at 12:30p, NPO after MN prior to procedure as well as driver post procedure/recovery/discharge. Pt stated understanding.  Called 12/17/2022
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
## 2022-12-18 ENCOUNTER — Ambulatory Visit
Admission: RE | Admit: 2022-12-18 | Discharge: 2022-12-18 | Disposition: A | Discharge status: Home / Self Care | Payer: Medicare PPO | Source: Ambulatory Visit | Attending: Internal Medicine | Admitting: Internal Medicine

## 2022-12-18 ENCOUNTER — Encounter: Payer: Self-pay | Admitting: Radiology

## 2022-12-18 DIAGNOSIS — E785 Hyperlipidemia, unspecified: Secondary | ICD-10-CM | POA: Diagnosis not present

## 2022-12-18 DIAGNOSIS — C3412 Malignant neoplasm of upper lobe, left bronchus or lung: Secondary | ICD-10-CM | POA: Insufficient documentation

## 2022-12-18 DIAGNOSIS — C787 Secondary malignant neoplasm of liver and intrahepatic bile duct: Secondary | ICD-10-CM | POA: Diagnosis not present

## 2022-12-18 DIAGNOSIS — J449 Chronic obstructive pulmonary disease, unspecified: Secondary | ICD-10-CM | POA: Diagnosis not present

## 2022-12-18 DIAGNOSIS — I10 Essential (primary) hypertension: Secondary | ICD-10-CM | POA: Diagnosis not present

## 2022-12-18 DIAGNOSIS — F17210 Nicotine dependence, cigarettes, uncomplicated: Secondary | ICD-10-CM | POA: Insufficient documentation

## 2022-12-18 HISTORY — PX: IR IMAGING GUIDED PORT INSERTION: IMG5740

## 2022-12-18 MED ORDER — MIDAZOLAM HCL 5 MG/5ML IJ SOLN
INTRAMUSCULAR | Status: AC | PRN
  Administered 2022-12-18: .5 mg via INTRAVENOUS
  Administered 2022-12-18: 1 mg via INTRAVENOUS

## 2022-12-18 MED ORDER — HEPARIN SOD (PORK) LOCK FLUSH 100 UNIT/ML IV SOLN
INTRAVENOUS | Status: AC
  Administered 2022-12-18: 500 [IU]
  Filled 2022-12-18: qty 5

## 2022-12-18 MED ORDER — SODIUM CHLORIDE 0.9 % IV SOLN: INTRAVENOUS | Status: DC

## 2022-12-18 MED ORDER — FENTANYL CITRATE (PF) 100 MCG/2ML IJ SOLN
INTRAMUSCULAR | Status: AC
  Filled 2022-12-18: qty 2

## 2022-12-18 MED ORDER — FENTANYL CITRATE (PF) 100 MCG/2ML IJ SOLN
INTRAMUSCULAR | Status: AC | PRN
  Administered 2022-12-18 (×2): 50 ug via INTRAVENOUS

## 2022-12-18 MED ORDER — MIDAZOLAM HCL 2 MG/2ML IJ SOLN
INTRAMUSCULAR | Status: AC | PRN
  Administered 2022-12-18: .5 mg via INTRAVENOUS

## 2022-12-18 MED ORDER — LIDOCAINE-EPINEPHRINE 1 %-1:100000 IJ SOLN
INTRAMUSCULAR | Status: AC
  Administered 2022-12-18: 20 mL
  Filled 2022-12-18: qty 1

## 2022-12-18 MED ORDER — MIDAZOLAM HCL 2 MG/2ML IJ SOLN
INTRAMUSCULAR | Status: AC
  Filled 2022-12-18: qty 2

## 2022-12-18 NOTE — Progress Notes (Signed)
Patient clinically stable post Right IR port placement per Dr Mugweru,tolerated well. Vitals stable pre and post procedure. Received  Versed 2 mg along with Fentanyl 100 mcg IV for procedure. Report given to Fransico Michael RN post procedure/recovery/331.

## 2022-12-18 NOTE — Procedures (Signed)
Vascular and Interventional Radiology Procedure Note  Patient: Martin Gomez. DOB: 1956-02-10 Medical Record Number: [id number] Note Date/Time: 12/18/22 1:38 PM   Performing Physician: Michaelle Birks, MD Assistant(s): None  Diagnosis: Lung cancer  Procedure: PORT PLACEMENT  Anesthesia: Conscious Sedation Complications: None Estimated Blood Loss: Minimal  Findings:  Successful right-sided port placement, with the tip of the catheter in the proximal right atrium.  Plan: Catheter ready for use.  See detailed procedure note with images in PACS. The patient tolerated the procedure well without incident or complication and was returned to Recovery in stable condition.    Michaelle Birks, MD [facility] Radiology   Pager. [place]

## 2022-12-19 MED FILL — Fosaprepitant Dimeglumine For IV Infusion 150 MG (Base Eq): INTRAVENOUS | Qty: 5 | Status: AC

## 2022-12-19 MED FILL — Dexamethasone Sodium Phosphate Inj 100 MG/10ML: INTRAMUSCULAR | Qty: 1 | Status: AC

## 2022-12-22 ENCOUNTER — Inpatient Hospital Stay (HOSPITAL_BASED_OUTPATIENT_CLINIC_OR_DEPARTMENT_OTHER): Payer: Medicare PPO | Admitting: Internal Medicine

## 2022-12-22 ENCOUNTER — Encounter: Payer: Self-pay | Admitting: Internal Medicine

## 2022-12-22 ENCOUNTER — Encounter: Payer: Self-pay | Admitting: *Deleted

## 2022-12-22 ENCOUNTER — Inpatient Hospital Stay: Payer: Medicare PPO

## 2022-12-22 VITALS — BP 129/84 | HR 87 | Temp 96.9°F | Resp 18 | Ht 70.0 in | Wt 175.3 lb

## 2022-12-22 DIAGNOSIS — C3412 Malignant neoplasm of upper lobe, left bronchus or lung: Secondary | ICD-10-CM | POA: Diagnosis not present

## 2022-12-22 DIAGNOSIS — R1310 Dysphagia, unspecified: Secondary | ICD-10-CM

## 2022-12-22 DIAGNOSIS — Z5111 Encounter for antineoplastic chemotherapy: Secondary | ICD-10-CM | POA: Diagnosis not present

## 2022-12-22 DIAGNOSIS — R918 Other nonspecific abnormal finding of lung field: Secondary | ICD-10-CM

## 2022-12-22 LAB — CBC WITH DIFFERENTIAL (CANCER CENTER ONLY)
Abs Immature Granulocytes: 0.05 10*3/uL (ref 0.00–0.07)
Basophils Absolute: 0 10*3/uL (ref 0.0–0.1)
Basophils Relative: 0 %
Eosinophils Absolute: 0.2 10*3/uL (ref 0.0–0.5)
Eosinophils Relative: 2 %
HCT: 39.7 % (ref 39.0–52.0)
Hemoglobin: 13 g/dL (ref 13.0–17.0)
Immature Granulocytes: 1 %
Lymphocytes Relative: 26 %
Lymphs Abs: 2.8 10*3/uL (ref 0.7–4.0)
MCH: 31.3 pg (ref 26.0–34.0)
MCHC: 32.7 g/dL (ref 30.0–36.0)
MCV: 95.7 fL (ref 80.0–100.0)
Monocytes Absolute: 0.9 10*3/uL (ref 0.1–1.0)
Monocytes Relative: 9 %
Neutro Abs: 6.9 10*3/uL (ref 1.7–7.7)
Neutrophils Relative %: 62 %
Platelet Count: 412 10*3/uL — ABNORMAL HIGH (ref 150–400)
RBC: 4.15 MIL/uL — ABNORMAL LOW (ref 4.22–5.81)
RDW: 13.5 % (ref 11.5–15.5)
WBC Count: 10.9 10*3/uL — ABNORMAL HIGH (ref 4.0–10.5)
nRBC: 0 % (ref 0.0–0.2)

## 2022-12-22 LAB — CMP (CANCER CENTER ONLY)
ALT: 42 U/L (ref 0–44)
AST: 41 U/L (ref 15–41)
Albumin: 3.5 g/dL (ref 3.5–5.0)
Alkaline Phosphatase: 173 U/L — ABNORMAL HIGH (ref 38–126)
Anion gap: 10 (ref 5–15)
BUN: 14 mg/dL (ref 8–23)
CO2: 25 mmol/L (ref 22–32)
Calcium: 9.2 mg/dL (ref 8.9–10.3)
Chloride: 103 mmol/L (ref 98–111)
Creatinine: 1.01 mg/dL (ref 0.61–1.24)
GFR, Estimated: >60 mL/min (ref >60)
Glucose, Bld: 133 mg/dL — ABNORMAL HIGH (ref 70–99)
Potassium: 3.4 mmol/L — ABNORMAL LOW (ref 3.5–5.1)
Sodium: 138 mmol/L (ref 135–145)
Total Bilirubin: 0.5 mg/dL (ref 0.3–1.2)
Total Protein: 7.3 g/dL (ref 6.5–8.1)

## 2022-12-22 LAB — LACTATE DEHYDROGENASE: LDH: 295 U/L — ABNORMAL HIGH (ref 98–192)

## 2022-12-22 LAB — TSH: TSH: 1.608 u[IU]/mL (ref 0.350–4.500)

## 2022-12-22 MED ORDER — SODIUM CHLORIDE 0.9 % IV SOLN
10.0000 mg | Freq: Once | INTRAVENOUS | Status: AC
  Administered 2022-12-22: 10 mg via INTRAVENOUS
  Filled 2022-12-22: qty 10

## 2022-12-22 MED ORDER — SODIUM CHLORIDE 0.9 % IV SOLN
150.0000 mg | Freq: Once | INTRAVENOUS | Status: AC
  Administered 2022-12-22: 150 mg via INTRAVENOUS
  Filled 2022-12-22: qty 150

## 2022-12-22 MED ORDER — PALONOSETRON HCL INJECTION 0.25 MG/5ML
0.2500 mg | Freq: Once | INTRAVENOUS | Status: AC
  Administered 2022-12-22: 0.25 mg via INTRAVENOUS
  Filled 2022-12-22: qty 5

## 2022-12-22 MED ORDER — SODIUM CHLORIDE 0.9 % IV SOLN
Freq: Once | INTRAVENOUS | Status: AC
  Filled 2022-12-22: qty 250

## 2022-12-22 MED ORDER — HEPARIN SOD (PORK) LOCK FLUSH 100 UNIT/ML IV SOLN
500.0000 [IU] | Freq: Once | INTRAVENOUS | Status: AC | PRN
  Administered 2022-12-22: 500 [IU]
  Filled 2022-12-22: qty 5

## 2022-12-22 MED ORDER — SODIUM CHLORIDE 0.9 % IV SOLN
552.5000 mg | Freq: Once | INTRAVENOUS | Status: AC
  Administered 2022-12-22: 550 mg via INTRAVENOUS
  Filled 2022-12-22: qty 55

## 2022-12-22 MED ORDER — SODIUM CHLORIDE 0.9 % IV SOLN
100.0000 mg/m2 | Freq: Once | INTRAVENOUS | Status: AC
  Administered 2022-12-22: 200 mg via INTRAVENOUS
  Filled 2022-12-22: qty 10

## 2022-12-22 MED ORDER — BENZONATATE 200 MG PO CAPS: 200.0000 mg | ORAL_CAPSULE | Freq: Three times a day (TID) | ORAL | 2 refills | Status: DC | PRN

## 2022-12-22 MED ORDER — SODIUM CHLORIDE 0.9 % IV SOLN
1200.0000 mg | Freq: Once | INTRAVENOUS | Status: AC
  Administered 2022-12-22: 1200 mg via INTRAVENOUS
  Filled 2022-12-22: qty 20

## 2022-12-22 MED FILL — Dexamethasone Sodium Phosphate Inj 100 MG/10ML: INTRAMUSCULAR | Qty: 1 | Status: AC

## 2022-12-22 NOTE — Progress Notes (Signed)
Reports appetite is stable with but has 6 lb wt loss since last visit.  Still having cough not relieved by Tussionex or Tessalon.  SOBr with cough episodes or on exertion.  Resting O2 saturation 94%

## 2022-12-22 NOTE — Addendum Note (Signed)
Addended by: Telford Nab on: 12/22/2022 01:32 PM   Modules accepted: Orders
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
## 2022-12-22 NOTE — Progress Notes (Signed)
Patient reports not taking prednisone 40 mg daily.  Henreitta Leber, PharmD

## 2022-12-22 NOTE — Patient Instructions (Signed)
[facility]  Discharge Instructions: Thank you for choosing [facility] to provide your oncology and hematology care.  If you have a lab appointment with the [facility], please go directly to the [facility] and check in at the registration area.  Wear comfortable clothing and clothing appropriate for easy access to any Portacath or PICC line.   We strive to give you quality time with your provider. You may need to reschedule your appointment if you arrive late (15 or more minutes).  Arriving late affects you and other patients whose appointments are after yours.  Also, if you miss three or more appointments without notifying the office, you may be dismissed from the clinic at the provider's discretion.      For prescription refill requests, have your pharmacy contact our office and allow 72 hours for refills to be completed.    Today you received the following chemotherapy and/or immunotherapy agents Atezolizumab, Carboplatin, and Etososide.      To help prevent nausea and vomiting after your treatment, we encourage you to take your nausea medication as directed.  BELOW ARE SYMPTOMS THAT SHOULD BE REPORTED IMMEDIATELY: *FEVER GREATER THAN 100.4 F (38 C) OR HIGHER *CHILLS OR SWEATING *NAUSEA AND VOMITING THAT IS NOT CONTROLLED WITH YOUR NAUSEA MEDICATION *UNUSUAL SHORTNESS OF BREATH *UNUSUAL BRUISING OR BLEEDING *URINARY PROBLEMS (pain or burning when urinating, or frequent urination) *BOWEL PROBLEMS (unusual diarrhea, constipation, pain near the anus) TENDERNESS IN MOUTH AND THROAT WITH OR WITHOUT PRESENCE OF ULCERS (sore throat, sores in mouth, or a toothache) UNUSUAL RASH, SWELLING OR PAIN  UNUSUAL VAGINAL DISCHARGE OR ITCHING   Items with * indicate a potential emergency and should be followed up as soon as possible or go to the Emergency Department if any problems should occur.  Please show the CHEMOTHERAPY ALERT CARD or  IMMUNOTHERAPY ALERT CARD at check-in to the Emergency Department and triage nurse.  Should you have questions after your visit or need to cancel or reschedule your appointment, please contact [facility]  [phone] and follow the prompts.  Office hours are 8:00 a.m. to 4:30 p.m. Monday - Friday. Please note that voicemails left after 4:00 p.m. may not be returned until the following business day.  We are closed weekends and major holidays. You have access to a nurse at all times for urgent questions. Please call the main number to the clinic [phone] and follow the prompts.  For any non-urgent questions, you may also contact your provider using MyChart. We now offer e-Visits for anyone 72 and older to request care online for non-urgent symptoms. For details visit [email or web address].   Also download the MyChart app! Go to the app store, search "MyChart", open the app, select [facility], and log in with your MyChart username and password.

## 2022-12-22 NOTE — Progress Notes (Signed)
Met with patient during follow up visit with Dr. Rogue Bussing to start chemotherapy today. All questions answered during visit. Reassurance provided. Pt informed that will be given print out of appts prior to leaving today and to call with any questions or needs. Pt verbalized understanding. Nothing further needed at his time.

## 2022-12-22 NOTE — Assessment & Plan Note (Addendum)
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 PET 2nd FEB 2024-  4.9 cm mass in the left perihilar upper lobe obstructing the apical  left upper lobe bronchi with direct mediastinal invasion and left hilar  and aortopulmonary lymphadenopathy; Bilateral adrenal nodules and masses, multiple hypoattenuating lesions  in the liver and right renal nodule concerning for metastatic disease. Pleural nodules in the posterior left pleural space measuring up to 1.5  cm compatible with pleural metastases.  #Discussed chemo immunotherapy for extensive stage small cell lung cancer includes carbo etoposide-Tecentriq every 3 weeks x 4 cycles.  Response would be evaluated with interim imaging after 2-3 cycles.  We will also recommend maintenance Tecentriq post chemotherapy.  I discussed the schedule in detail.  Discussed treatments are palliative not curative.   Discussed the potential side effects including but not limited to-increasing fatigue, nausea vomiting, diarrhea, hair loss, sores in the mouth, increase risk of infection and also neuropathy.   # Proceed with cycle #1 chemotherapy today/this week. Growth factor would be given as prophylaxis for chemotherapy-induced neutropenia to prevent febrile neutropenias.   # I discussed the mechanism of action; The goal of therapy is palliative; and length of treatments are likely ongoing/based upon the results of the scans. Discussed the potential side effects of immunotherapy including but not limited to diarrhea; skin rash; elevated LFTs/endocrine abnormalities etc.   # JAN 2024-MRI brain with and without contrast- 4 mm right parafalcine enhancing lesion favored to reflect a small meningioma; however, given the history and absence of prior studies for comparison, a dural-based metastatic lesion can not be entirely excluded. 1.2 cm focus of signal abnormality in the right aspect of the clivus suspicious for osseous metastatic  disease.  Will repeat MRI brain in 2 months or so. Stable.   # Intermittent hematuria- ? Etiology [Dr.Snininski]-status post antibiotics monitor for now. stable  # Smoking: Active smoker; recommend quitting/cutting down.  # Pain-chronic right sciatic pain not any worse.  Denies any worsening rib pain.  Continue Tylenol as needed.   # COPD/cough- continue with inhalers.also recommend call PCP re: nebs.   Continue Tessalon Perles/ Tussionex prn.  Possible aspiration see below .  # Hoarseness of voice/ choking spells- ? ENT evaluation. Discussed re: aspitaion; speech path evaluation.    ? zometa # DISPOSITION: #  chemo today carbo-Etop-tecentriq/ this week #  in 1 week- APP- labs- cbc/bmp; possible IVFs over 1  hour # follow up  in 3 weeks- MD; labs- cbc/cmp; LDH; TSH; carbo-Etop-tecentriq [new]; d-2 &3- Etop;d-4-undeyca- Dr.B

## 2022-12-23 ENCOUNTER — Inpatient Hospital Stay: Payer: Medicare PPO

## 2022-12-23 ENCOUNTER — Encounter: Payer: Self-pay | Admitting: Licensed Clinical Social Worker

## 2022-12-23 ENCOUNTER — Telehealth: Payer: Self-pay

## 2022-12-23 VITALS — BP 125/78 | HR 82 | Temp 96.0°F | Resp 16

## 2022-12-23 DIAGNOSIS — Z5111 Encounter for antineoplastic chemotherapy: Secondary | ICD-10-CM | POA: Diagnosis not present

## 2022-12-23 DIAGNOSIS — C3412 Malignant neoplasm of upper lobe, left bronchus or lung: Secondary | ICD-10-CM

## 2022-12-23 LAB — T4: T4, Total: 7.8 ug/dL (ref 4.5–12.0)

## 2022-12-23 MED ORDER — HEPARIN SOD (PORK) LOCK FLUSH 100 UNIT/ML IV SOLN
500.0000 [IU] | Freq: Once | INTRAVENOUS | Status: AC | PRN
  Administered 2022-12-23: 500 [IU]
  Filled 2022-12-23: qty 5

## 2022-12-23 MED ORDER — SODIUM CHLORIDE 0.9 % IV SOLN
10.0000 mg | Freq: Once | INTRAVENOUS | Status: AC
  Administered 2022-12-23: 10 mg via INTRAVENOUS
  Filled 2022-12-23: qty 10

## 2022-12-23 MED ORDER — SODIUM CHLORIDE 0.9 % IV SOLN
Freq: Once | INTRAVENOUS | Status: AC
  Filled 2022-12-23: qty 250

## 2022-12-23 MED ORDER — SODIUM CHLORIDE 0.9 % IV SOLN
100.0000 mg/m2 | Freq: Once | INTRAVENOUS | Status: AC
  Administered 2022-12-23: 200 mg via INTRAVENOUS
  Filled 2022-12-23: qty 10

## 2022-12-23 MED FILL — Dexamethasone Sodium Phosphate Inj 100 MG/10ML: INTRAMUSCULAR | Qty: 1 | Status: AC

## 2022-12-23 NOTE — Patient Instructions (Signed)
[facility]  Discharge Instructions: Thank you for choosing [facility] to provide your oncology and hematology care.  If you have a lab appointment with the Cancer Center, please go directly to the Cancer Center and check in at the registration area.  Wear comfortable clothing and clothing appropriate for easy access to any Portacath or PICC line.   We strive to give you quality time with your provider. You may need to reschedule your appointment if you arrive late (15 or more minutes).  Arriving late affects you and other patients whose appointments are after yours.  Also, if you miss three or more appointments without notifying the office, you may be dismissed from the clinic at the provider's discretion.      For prescription refill requests, have your pharmacy contact our office and allow 72 hours for refills to be completed.    Today you received the following chemotherapy and/or immunotherapy agents: etoposide     To help prevent nausea and vomiting after your treatment, we encourage you to take your nausea medication as directed.  BELOW ARE SYMPTOMS THAT SHOULD BE REPORTED IMMEDIATELY: *FEVER GREATER THAN 100.4 F (38 C) OR HIGHER *CHILLS OR SWEATING *NAUSEA AND VOMITING THAT IS NOT CONTROLLED WITH YOUR NAUSEA MEDICATION *UNUSUAL SHORTNESS OF BREATH *UNUSUAL BRUISING OR BLEEDING *URINARY PROBLEMS (pain or burning when urinating, or frequent urination) *BOWEL PROBLEMS (unusual diarrhea, constipation, pain near the anus) TENDERNESS IN MOUTH AND THROAT WITH OR WITHOUT PRESENCE OF ULCERS (sore throat, sores in mouth, or a toothache) UNUSUAL RASH, SWELLING OR PAIN  UNUSUAL VAGINAL DISCHARGE OR ITCHING   Items with * indicate a potential emergency and should be followed up as soon as possible or go to the Emergency Department if any problems should occur.  Please show the CHEMOTHERAPY ALERT CARD or IMMUNOTHERAPY ALERT CARD at check-in to  the Emergency Department and triage nurse.  Should you have questions after your visit or need to cancel or reschedule your appointment, please contact [facility] [place] [facility]  [phone] and follow the prompts.  Office hours are 8:00 a.m. to 4:30 p.m. Monday - Friday. Please note that voicemails left after 4:00 p.m. may not be returned until the following business day.  We are closed weekends and major holidays. You have access to a nurse at all times for urgent questions. Please call the main number to the clinic [phone] and follow the prompts.  For any non-urgent questions, you may also contact your provider using MyChart. We now offer e-Visits for anyone 18 and older to request care online for non-urgent symptoms. For details visit [email or web address].   Also download the MyChart app! Go to the app store, search "MyChart", open the app, select , and log in with your MyChart username and password.    

## 2022-12-23 NOTE — Progress Notes (Signed)
[place] [facility]  Clinical Social Work was referred by medical provider for assessment of psychosocial needs.  Clinical Social Worker attempted to contact patient by phone  to offer support and assess for needs.  Csw left voicemail and request for return call.   SA  Adelene Amas, LCSW  Clinical Social Worker [facility]

## 2022-12-23 NOTE — Telephone Encounter (Signed)
Telephone call to patient for follow up after receiving first infusion.   No answer but left message stating we were calling to check on them.  Encouraged patient to call for any questions or concerns.   

## 2022-12-23 NOTE — Progress Notes (Signed)
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 Brain lesion.  Past medical history of smoking, HTN, hypercholesterolemia.  Patient receiving carboplatin, etoposide and tecentriq.  Met with patient during infusion.  Reports that appetite has been good.  Coughing and hoarse during visit.  Reports that he eats usually a good breakfast and lunch and lighter supper meal.  Denies nutrition impact symptoms at this time.    Medications: zofran, compazine, omega 3, vit D   Labs: K 3.4, glucose 133   Anthropometrics:   Height: 70 inches Weight: 175 lb 3/18 182 lb on 3/5 BMI: 25  4% weight loss in the last 2 weeks, concerning Patient does not think scales were correct when weighed on 3/18.  Has not noticed changes in how pants fit.    Estimated Energy Needs  Kcals: 2000-2400 Protein: 100-120 g Fluid: > 2 L   NUTRITION DIAGNOSIS: Increased nutrient needs related to cancer as evidenced by estimated energy needs   INTERVENTION:  Discussed importance of good nutrition during treatment Encouraged foods high in protein at each meal Encouraged oral nutrition supplements. Samples and coupons given Contact information provided   MONITORING, EVALUATION, GOAL: weight trends, intake   Next Visit: Tuesday, April 9 during infusion  Martin Gomez B. Zenia Resides, [place], [place] Registered Dietitian [phone]

## 2022-12-24 ENCOUNTER — Inpatient Hospital Stay: Payer: Medicare PPO

## 2022-12-24 VITALS — BP 143/97 | HR 95 | Temp 96.0°F | Resp 20

## 2022-12-24 DIAGNOSIS — C3412 Malignant neoplasm of upper lobe, left bronchus or lung: Secondary | ICD-10-CM

## 2022-12-24 DIAGNOSIS — Z5111 Encounter for antineoplastic chemotherapy: Secondary | ICD-10-CM | POA: Diagnosis not present

## 2022-12-24 MED ORDER — SODIUM CHLORIDE 0.9 % IV SOLN
10.0000 mg | Freq: Once | INTRAVENOUS | Status: AC
  Administered 2022-12-24: 10 mg via INTRAVENOUS
  Filled 2022-12-24: qty 10

## 2022-12-24 MED ORDER — SODIUM CHLORIDE 0.9 % IV SOLN
100.0000 mg/m2 | Freq: Once | INTRAVENOUS | Status: AC
  Administered 2022-12-24: 200 mg via INTRAVENOUS
  Filled 2022-12-24: qty 10

## 2022-12-24 MED ORDER — SODIUM CHLORIDE 0.9 % IV SOLN
Freq: Once | INTRAVENOUS | Status: AC
  Filled 2022-12-24: qty 250

## 2022-12-24 MED ORDER — HEPARIN SOD (PORK) LOCK FLUSH 100 UNIT/ML IV SOLN
INTRAVENOUS | Status: AC
  Filled 2022-12-24: qty 5

## 2022-12-24 MED ORDER — HEPARIN SOD (PORK) LOCK FLUSH 100 UNIT/ML IV SOLN
500.0000 [IU] | Freq: Once | INTRAVENOUS | Status: AC | PRN
  Administered 2022-12-24: 500 [IU]
  Filled 2022-12-24: qty 5

## 2022-12-24 NOTE — Patient Instructions (Signed)
[facility] [place] [facility]  Discharge Instructions: Thank you for choosing [facility] to provide your oncology and hematology care.  If you have a lab appointment with the Cancer Center, please go directly to the Cancer Center and check in at the registration area.  Wear comfortable clothing and clothing appropriate for easy access to any Portacath or PICC line.   We strive to give you quality time with your provider. You may need to reschedule your appointment if you arrive late (15 or more minutes).  Arriving late affects you and other patients whose appointments are after yours.  Also, if you miss three or more appointments without notifying the office, you may be dismissed from the clinic at the provider's discretion.      For prescription refill requests, have your pharmacy contact our office and allow 72 hours for refills to be completed.    Today you received the following chemotherapy and/or immunotherapy agents: etoposide     To help prevent nausea and vomiting after your treatment, we encourage you to take your nausea medication as directed.  BELOW ARE SYMPTOMS THAT SHOULD BE REPORTED IMMEDIATELY: *FEVER GREATER THAN 100.4 F (38 C) OR HIGHER *CHILLS OR SWEATING *NAUSEA AND VOMITING THAT IS NOT CONTROLLED WITH YOUR NAUSEA MEDICATION *UNUSUAL SHORTNESS OF BREATH *UNUSUAL BRUISING OR BLEEDING *URINARY PROBLEMS (pain or burning when urinating, or frequent urination) *BOWEL PROBLEMS (unusual diarrhea, constipation, pain near the anus) TENDERNESS IN MOUTH AND THROAT WITH OR WITHOUT PRESENCE OF ULCERS (sore throat, sores in mouth, or a toothache) UNUSUAL RASH, SWELLING OR PAIN  UNUSUAL VAGINAL DISCHARGE OR ITCHING   Items with * indicate a potential emergency and should be followed up as soon as possible or go to the Emergency Department if any problems should occur.  Please show the CHEMOTHERAPY ALERT CARD or IMMUNOTHERAPY ALERT CARD at check-in to  the Emergency Department and triage nurse.  Should you have questions after your visit or need to cancel or reschedule your appointment, please contact [facility] [place] [facility]  [phone] and follow the prompts.  Office hours are 8:00 a.m. to 4:30 p.m. Monday - Friday. Please note that voicemails left after 4:00 p.m. may not be returned until the following business day.  We are closed weekends and major holidays. You have access to a nurse at all times for urgent questions. Please call the main number to the clinic [phone] and follow the prompts.  For any non-urgent questions, you may also contact your provider using MyChart. We now offer e-Visits for anyone 18 and older to request care online for non-urgent symptoms. For details visit [email or web address].   Also download the MyChart app! Go to the app store, search "MyChart", open the app, select [facility], and log in with your MyChart username and password.    

## 2022-12-25 ENCOUNTER — Inpatient Hospital Stay: Payer: Medicare PPO

## 2022-12-25 DIAGNOSIS — C3412 Malignant neoplasm of upper lobe, left bronchus or lung: Secondary | ICD-10-CM

## 2022-12-25 DIAGNOSIS — Z5111 Encounter for antineoplastic chemotherapy: Secondary | ICD-10-CM | POA: Diagnosis not present

## 2022-12-25 MED ORDER — PEGFILGRASTIM-CBQV 6 MG/0.6ML ~~LOC~~ SOSY
6.0000 mg | PREFILLED_SYRINGE | Freq: Once | SUBCUTANEOUS | Status: AC
  Administered 2022-12-25: 6 mg via SUBCUTANEOUS
  Filled 2022-12-25: qty 0.6

## 2022-12-29 ENCOUNTER — Inpatient Hospital Stay: Payer: Medicare PPO

## 2022-12-29 ENCOUNTER — Inpatient Hospital Stay (HOSPITAL_BASED_OUTPATIENT_CLINIC_OR_DEPARTMENT_OTHER): Payer: Medicare PPO | Admitting: Nurse Practitioner

## 2022-12-29 ENCOUNTER — Encounter: Payer: Self-pay | Admitting: Nurse Practitioner

## 2022-12-29 VITALS — BP 113/75 | HR 75 | Temp 97.1°F | Wt 174.0 lb

## 2022-12-29 DIAGNOSIS — G893 Neoplasm related pain (acute) (chronic): Secondary | ICD-10-CM

## 2022-12-29 DIAGNOSIS — Z09 Encounter for follow-up examination after completed treatment for conditions other than malignant neoplasm: Secondary | ICD-10-CM

## 2022-12-29 DIAGNOSIS — C3412 Malignant neoplasm of upper lobe, left bronchus or lung: Secondary | ICD-10-CM | POA: Diagnosis not present

## 2022-12-29 DIAGNOSIS — Z5111 Encounter for antineoplastic chemotherapy: Secondary | ICD-10-CM | POA: Diagnosis not present

## 2022-12-29 LAB — CBC WITH DIFFERENTIAL (CANCER CENTER ONLY)
Abs Immature Granulocytes: 0.07 10*3/uL (ref 0.00–0.07)
Basophils Absolute: 0 10*3/uL (ref 0.0–0.1)
Basophils Relative: 0 %
Eosinophils Absolute: 0.1 10*3/uL (ref 0.0–0.5)
Eosinophils Relative: 1 %
HCT: 38.5 % — ABNORMAL LOW (ref 39.0–52.0)
Hemoglobin: 12.5 g/dL — ABNORMAL LOW (ref 13.0–17.0)
Immature Granulocytes: 1 %
Lymphocytes Relative: 20 %
Lymphs Abs: 2.4 10*3/uL (ref 0.7–4.0)
MCH: 31.6 pg (ref 26.0–34.0)
MCHC: 32.5 g/dL (ref 30.0–36.0)
MCV: 97.5 fL (ref 80.0–100.0)
Monocytes Absolute: 0.4 10*3/uL (ref 0.1–1.0)
Monocytes Relative: 3 %
Neutro Abs: 9.3 10*3/uL — ABNORMAL HIGH (ref 1.7–7.7)
Neutrophils Relative %: 75 %
Platelet Count: 248 10*3/uL (ref 150–400)
RBC: 3.95 MIL/uL — ABNORMAL LOW (ref 4.22–5.81)
RDW: 13.4 % (ref 11.5–15.5)
Smear Review: NORMAL
WBC Count: 12.3 10*3/uL — ABNORMAL HIGH (ref 4.0–10.5)
nRBC: 0 % (ref 0.0–0.2)

## 2022-12-29 LAB — BASIC METABOLIC PANEL - CANCER CENTER ONLY
Anion gap: 6 (ref 5–15)
BUN: 21 mg/dL (ref 8–23)
CO2: 25 mmol/L (ref 22–32)
Calcium: 8.8 mg/dL — ABNORMAL LOW (ref 8.9–10.3)
Chloride: 105 mmol/L (ref 98–111)
Creatinine: 0.95 mg/dL (ref 0.61–1.24)
GFR, Estimated: >60 mL/min (ref >60)
Glucose, Bld: 119 mg/dL — ABNORMAL HIGH (ref 70–99)
Potassium: 3.6 mmol/L (ref 3.5–5.1)
Sodium: 136 mmol/L (ref 135–145)

## 2022-12-29 MED ORDER — HEPARIN SOD (PORK) LOCK FLUSH 100 UNIT/ML IV SOLN
500.0000 [IU] | Freq: Once | INTRAVENOUS | Status: AC
  Administered 2022-12-29: 500 [IU] via INTRAVENOUS
  Filled 2022-12-29: qty 5

## 2022-12-29 MED ORDER — GABAPENTIN 100 MG PO CAPS: 100.0000 mg | ORAL_CAPSULE | Freq: Two times a day (BID) | ORAL | 0 refills | Status: DC | PRN

## 2022-12-29 MED ORDER — OXYCODONE HCL 5 MG PO TABS: 5.0000 mg | ORAL_TABLET | Freq: Four times a day (QID) | ORAL | 0 refills | Status: DC | PRN

## 2022-12-29 MED ORDER — SODIUM CHLORIDE 0.9% FLUSH
10.0000 mL | Freq: Once | INTRAVENOUS | Status: AC
  Administered 2022-12-29: 10 mL via INTRAVENOUS
  Filled 2022-12-29: qty 10

## 2022-12-29 MED ORDER — SODIUM CHLORIDE 0.9 % IV SOLN
INTRAVENOUS | Status: DC
  Filled 2022-12-29 (×2): qty 250

## 2022-12-29 NOTE — Patient Instructions (Signed)
It is common for patients who are undergoing treatment and taking certain prescribed medications to experience side-effects with constipation.  If you experience constipation, please take stool softeners such as Senna and/or Miralax every day to avoid constipation.  These medications are available over the counter.  Of course, if you have diarrhea, stop taking stool softeners.  Drinking plenty of fluid, eating fruits and vegetable, and being active also reduces the risk of constipation.   If despite taking stool softeners, and you still have no bowel movement for 2 days or more than your normal bowel habit frequency, please take one of the following over the counter laxatives:  Milk of Magnesia or Mag Citrate everyday and contact me immediately for further instructions.  The goal is to have at least one bowel movement every day or every other day without pain or straining.   It was a pleasure meeting you today and thank you for allowing me to participate in your care. -Goldie Tregoning, NP  
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
## 2022-12-30 ENCOUNTER — Encounter: Payer: Self-pay | Admitting: Licensed Clinical Social Worker

## 2022-12-30 NOTE — Progress Notes (Signed)
[facility] Clinical Social Work  Clinical Social Work was referred by medical provider for assessment of psychosocial needs.  Clinical Social Worker attempted to contact patient by phone  to offer support and assess for needs.  CSW left voicemail with contact information and request for return call.    TA   Hodges Treiber, LCSW  Clinical Social Worker [facility]          

## 2023-01-01 ENCOUNTER — Ambulatory Visit
Admission: RE | Admit: 2023-01-01 | Discharge: 2023-01-01 | Disposition: A | Discharge status: Home / Self Care | Payer: Medicare PPO | Source: Ambulatory Visit | Attending: Internal Medicine | Admitting: Internal Medicine

## 2023-01-01 DIAGNOSIS — R1310 Dysphagia, unspecified: Secondary | ICD-10-CM | POA: Insufficient documentation

## 2023-01-01 NOTE — Progress Notes (Signed)
Modified Barium Swallow Study  Patient Details  Name: Martin Gomez. MRN: [id number] Date of Birth: [birth year]  Today's Date: 01/01/2023  Modified Barium Swallow completed.  Full report located under Chart Review in the Imaging Section.  History of Present Illness Pt is a  67 y.o. male w/ current smoking/tobacco use who is diagnosed with small cell lung cancer, metastatic to liver, adrenal, bone, currently s/p cycle 1 of carbo-etoposide-atexolizumab, who returns to clinic for chemotherapy follow up. He continues to have chronic cough for ~6 minths outside of meals, but often after meals, which is unchanged but also unrelieved. Has tried cough medicine including cough syrup with codeine without relief. Tessalon helped.    Per Imaging: Jan 2024- CT-noncontrast lung cancer screening - approximately 4.5 cm left lower hilar mass; involving the mediastinum; multiple lesions; also adrenal lesion. PET 2nd Feb 2024-  4.9 cm mass in the left perihilar upper lobe obstructing the apical  left upper lobe bronchi with direct mediastinal invasion and left hilar  and aortopulmonary lymphadenopathy; Metastatic hepatic, adrenal and osseous disease.  Feb MRI Brain: 1. 4 mm right parafalcine enhancing lesion favored to reflect a small meningioma; however, given the history and absence of prior studies for comparison, a dural-based metastatic lesion can not be entirely excluded.   Pt lives at home independently w/ Wife.  A/O x4. No reports/dxs/txs of pneumonia.  Diet is unchanged currently; no overt weight loss per pt.  Pt does not report any overt difficulty swallowing nor eating/drinking at meals -- "I get choked every once in awhile when I am drinking something" (but, he denied any consistency or frequency to this).   Clinical Impression Pt presents with grossly functional oropharyngeal swallowing in setting of Denture wear/use, Xerostomia, and generalized weakness d/t ongoing treatment for Cancer (See H&P re:  Lung Cancer et al dx, tx). No laryngeal penetration nor aspiration occurred during this study today.   Oral stage is characterized by adequate lip closure, bolus preparation and containment, and anterior to posterior transit. Swallow initiation occurs at the level of the valleculae.  Pharyngeal stage is noted for mildly reduced tongue base retraction and pharyngeal stripping intermittently (likely impact from generalized weakness and Xerostomia), adequate hyolaryngeal excursion, and adequate pharyngeal constriction. Epiglottic deflection is complete; there is No penetration or aspiration. There is intermittent trace+ valleculae residue, greater with thicker viscosities and solid consistencies, which is cleared by a volitional/cued dry swallow.   Amplitude/duration of cricopharyngeus opening is WFL. There is adequate/complete clearance through the cervical esophagus. An esophageal view and sweep was performed in the lateral lateral position which was fairly unremarkable. Slight prominance of the CP segment was noted w/ slight bulging on both the anterior and posterior surface of the cervical esophagus was noted during the swallow -- no impedence of clearance of trials. A 13 mm barium tablet given in Puree was retained in the distal esophagus upon sweep. W/ Time and a f/u sip of liquid, the tablet moved into the stomach per Radiologist, NP.    Consistencies tested were thin liquids x2 tsps, 1 cup sip, 2 sequential sips, nectar x1 tsp, 1 cup sip, 2 sequential cup sips, honey x1 tsp, pudding x1 tsp, regular solid (1/2 graham cracker with pudding), and 13 mm barium tablet with puree. Pt stated he had been told about using a Puree food for Pill swallowing by his medical team but felt it was unnecessary currently, "I'll do it when it gets that bad but I hope it doesn't. It's usally with  the larger pills that I have a problem.".   Education given on general aspiration precautions, Xerostomia, food consistencies/prep,  Pill swallowing w/ a Puree; and viewing of the MBSS completed w pt. Recommendations given re: f/u w/ ENT for vocal cord/voice assessment; potential f/u w/ skilled services for Voice therapy, per pt desire. GI f/u re: Esophageal Dysmotility and its role in pt's c/o hacking cough after meals; REFLUX(?).  No further ST indicated re: swallowing. Factors that may increase risk of adverse event in presence of aspiration ([place] 2021): Frail or deconditioned (Lung Cancer - see chart notes)  Swallow Evaluation Recommendations Recommendations: PO diet PO Diet Recommendation: Regular;Dysphagia 3 (Mechanical soft);Thin liquids (Level 0) (cut, moistened foods) Liquid Administration via: Cup Medication Administration: Whole meds with puree (as recommended by MD also) Supervision: Patient able to self-feed Swallowing strategies  : Slow rate;Small bites/sips;Minimize environmental distractions;Follow solids with liquids Postural changes: Position pt fully upright for meals;Stay upright 30-60 min after meals (REFLUX precautions) Oral care recommendations: Oral care BID (2x/day);Pt independent with oral care Recommended consults: Consider ENT consultation;Consider GI consultation;Consider esophageal assessment;Consider dietitian consultation        Orinda Kenner, [place], [place] Speech Language Pathologist Rehab Services; Rew [phone] (ascom) Donaldson Richter 01/01/2023,6:23 PM

## 2023-01-06 ENCOUNTER — Other Ambulatory Visit: Payer: Self-pay

## 2023-01-09 MED FILL — Dexamethasone Sodium Phosphate Inj 100 MG/10ML: INTRAMUSCULAR | Qty: 1 | Status: AC

## 2023-01-09 MED FILL — Fosaprepitant Dimeglumine For IV Infusion 150 MG (Base Eq): INTRAVENOUS | Qty: 5 | Status: AC

## 2023-01-12 ENCOUNTER — Encounter: Payer: Self-pay | Admitting: *Deleted

## 2023-01-12 ENCOUNTER — Inpatient Hospital Stay: Payer: Medicare PPO | Attending: Internal Medicine | Admitting: Internal Medicine

## 2023-01-12 ENCOUNTER — Inpatient Hospital Stay: Payer: Medicare PPO

## 2023-01-12 VITALS — BP 108/74 | HR 86 | Temp 95.9°F | Resp 18 | Ht 70.0 in | Wt 179.6 lb

## 2023-01-12 DIAGNOSIS — Z79899 Other long term (current) drug therapy: Secondary | ICD-10-CM | POA: Diagnosis not present

## 2023-01-12 DIAGNOSIS — R590 Localized enlarged lymph nodes: Secondary | ICD-10-CM | POA: Insufficient documentation

## 2023-01-12 DIAGNOSIS — C787 Secondary malignant neoplasm of liver and intrahepatic bile duct: Secondary | ICD-10-CM | POA: Insufficient documentation

## 2023-01-12 DIAGNOSIS — F17210 Nicotine dependence, cigarettes, uncomplicated: Secondary | ICD-10-CM | POA: Diagnosis not present

## 2023-01-12 DIAGNOSIS — Z5111 Encounter for antineoplastic chemotherapy: Secondary | ICD-10-CM | POA: Diagnosis present

## 2023-01-12 DIAGNOSIS — C782 Secondary malignant neoplasm of pleura: Secondary | ICD-10-CM | POA: Insufficient documentation

## 2023-01-12 DIAGNOSIS — C3412 Malignant neoplasm of upper lobe, left bronchus or lung: Secondary | ICD-10-CM | POA: Insufficient documentation

## 2023-01-12 DIAGNOSIS — Z79620 Long term (current) use of immunosuppressive biologic: Secondary | ICD-10-CM | POA: Insufficient documentation

## 2023-01-12 DIAGNOSIS — J449 Chronic obstructive pulmonary disease, unspecified: Secondary | ICD-10-CM | POA: Insufficient documentation

## 2023-01-12 LAB — CBC WITH DIFFERENTIAL (CANCER CENTER ONLY)
Abs Immature Granulocytes: 0.43 10*3/uL — ABNORMAL HIGH (ref 0.00–0.07)
Basophils Absolute: 0.1 10*3/uL (ref 0.0–0.1)
Basophils Relative: 1 %
Eosinophils Absolute: 0.1 10*3/uL (ref 0.0–0.5)
Eosinophils Relative: 0 %
HCT: 37.3 % — ABNORMAL LOW (ref 39.0–52.0)
Hemoglobin: 12.1 g/dL — ABNORMAL LOW (ref 13.0–17.0)
Immature Granulocytes: 3 %
Lymphocytes Relative: 27 %
Lymphs Abs: 3.3 10*3/uL (ref 0.7–4.0)
MCH: 31.6 pg (ref 26.0–34.0)
MCHC: 32.4 g/dL (ref 30.0–36.0)
MCV: 97.4 fL (ref 80.0–100.0)
Monocytes Absolute: 1.2 10*3/uL — ABNORMAL HIGH (ref 0.1–1.0)
Monocytes Relative: 10 %
Neutro Abs: 7.4 10*3/uL (ref 1.7–7.7)
Neutrophils Relative %: 59 %
Platelet Count: 349 10*3/uL (ref 150–400)
RBC: 3.83 MIL/uL — ABNORMAL LOW (ref 4.22–5.81)
RDW: 14.6 % (ref 11.5–15.5)
WBC Count: 12.5 10*3/uL — ABNORMAL HIGH (ref 4.0–10.5)
nRBC: 0 % (ref 0.0–0.2)

## 2023-01-12 LAB — CMP (CANCER CENTER ONLY)
ALT: 17 U/L (ref 0–44)
AST: 21 U/L (ref 15–41)
Albumin: 3.4 g/dL — ABNORMAL LOW (ref 3.5–5.0)
Alkaline Phosphatase: 125 U/L (ref 38–126)
Anion gap: 7 (ref 5–15)
BUN: 16 mg/dL (ref 8–23)
CO2: 23 mmol/L (ref 22–32)
Calcium: 8.7 mg/dL — ABNORMAL LOW (ref 8.9–10.3)
Chloride: 106 mmol/L (ref 98–111)
Creatinine: 1.14 mg/dL (ref 0.61–1.24)
GFR, Estimated: >60 mL/min (ref >60)
Glucose, Bld: 136 mg/dL — ABNORMAL HIGH (ref 70–99)
Potassium: 3.8 mmol/L (ref 3.5–5.1)
Sodium: 136 mmol/L (ref 135–145)
Total Bilirubin: 0.3 mg/dL (ref 0.3–1.2)
Total Protein: 7.2 g/dL (ref 6.5–8.1)

## 2023-01-12 MED ORDER — SODIUM CHLORIDE 0.9 % IV SOLN
1200.0000 mg | Freq: Once | INTRAVENOUS | Status: AC
  Administered 2023-01-12: 1200 mg via INTRAVENOUS
  Filled 2023-01-12: qty 20

## 2023-01-12 MED ORDER — SODIUM CHLORIDE 0.9 % IV SOLN
10.0000 mg | Freq: Once | INTRAVENOUS | Status: AC
  Administered 2023-01-12: 10 mg via INTRAVENOUS
  Filled 2023-01-12: qty 10

## 2023-01-12 MED ORDER — PALONOSETRON HCL INJECTION 0.25 MG/5ML
0.2500 mg | Freq: Once | INTRAVENOUS | Status: AC
  Administered 2023-01-12: 0.25 mg via INTRAVENOUS
  Filled 2023-01-12: qty 5

## 2023-01-12 MED ORDER — SODIUM CHLORIDE 0.9 % IV SOLN
503.5000 mg | Freq: Once | INTRAVENOUS | Status: AC
  Administered 2023-01-12: 500 mg via INTRAVENOUS
  Filled 2023-01-12: qty 50

## 2023-01-12 MED ORDER — SODIUM CHLORIDE 0.9 % IV SOLN
100.0000 mg/m2 | Freq: Once | INTRAVENOUS | Status: AC
  Administered 2023-01-12: 200 mg via INTRAVENOUS
  Filled 2023-01-12: qty 10

## 2023-01-12 MED ORDER — SODIUM CHLORIDE 0.9 % IV SOLN
150.0000 mg | Freq: Once | INTRAVENOUS | Status: AC
  Administered 2023-01-12: 150 mg via INTRAVENOUS
  Filled 2023-01-12: qty 150

## 2023-01-12 MED ORDER — SODIUM CHLORIDE 0.9 % IV SOLN
Freq: Once | INTRAVENOUS | Status: AC
  Filled 2023-01-12: qty 250

## 2023-01-12 MED ORDER — OXYCODONE-ACETAMINOPHEN 7.5-325 MG PO TABS: ORAL_TABLET | ORAL | 0 refills | Status: DC

## 2023-01-12 MED ORDER — HEPARIN SOD (PORK) LOCK FLUSH 100 UNIT/ML IV SOLN
500.0000 [IU] | Freq: Once | INTRAVENOUS | Status: AC | PRN
  Administered 2023-01-12: 500 [IU]
  Filled 2023-01-12: qty 5

## 2023-01-12 MED FILL — Dexamethasone Sodium Phosphate Inj 100 MG/10ML: INTRAMUSCULAR | Qty: 1 | Status: AC

## 2023-01-12 NOTE — Progress Notes (Signed)
Patient is doing better with appetite and overall well,  the 5 mg oxy hasn't help with pain so he took 15 mg  and that seemed to help and calmed his cough.

## 2023-01-12 NOTE — Progress Notes (Signed)
Met with patient during follow up visit with Dr. [name]. All questions answered during visit. Pt did not have any needs at this time. Instructed to call with any further questions or needs. Pt verbalized understanding.

## 2023-01-12 NOTE — Progress Notes (Signed)
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 etoposide-Tecentriq every 3 weeks; recommend maintenance Tecentriq post chemotherapy.   # Proceed with cycle #2 of carbo etoposide-Tecentriq every 3 weeks of planned 4 cycles;Labs today reviewed;  acceptable for treatment today.  Response would be evaluated with interim imaging after 3 cycles.  Will order at next visit.  With Growth factor  # JAN 2024-MRI brain with and without contrast- 4 mm right parafalcine enhancing lesion favored to reflect a small meningioma; however, given the history and absence of prior studies for comparison, a dural-based metastatic lesion can not be entirely excluded.  Will repeat MRI brain today/ordered.   # Intermittent hematuria- ? Etiology [Dr.Snininski]-status post antibiotics monitor for now. stable  # Smoking: Active smoker; recommend quitting/cutting down.  # Pain- Right posterior fifth rib has an SUV max of 3.20. Right ninth posterior rib has an SUV max of 2.49. Right iliac bone lesion has an SUV max of 4; left femur lesion.  Discuss/ordered Zometa.  Check vitamin D levels.  If not better- recommend evaluation with radiation.   Percocet 7.5/325 1 to 2 pills every 12 hours.  # COPD/cough- continue with inhalers.also recommend call PCP re: nebs.   Continue Tessalon Perles/ Tussionex prn.  Possible aspiration see below .  # Hoarseness of voice/ choking spells- refer to  ENT evaluation. Discussed re: aspiration; s/Gomez speech path evaluation.   ? zometa # DISPOSITION: # refer to ENT re:  hoarseness of voice/lung cnacer #  chemo today carbo-Etop-tecentriq/ this week #  in 1 week- APP- labs- cbc/bmp; possible IVFs over 1  hour # follow up  in 3 weeks- MD; labs- cbc/cmp; LDH; TSH; vit D 25-OH; carbo-Etop-tecentriq [new]; d-2 &3- Etop;d-4-undeyca; MRI Brain prior- Dr.B   All questions were answered. The patient knows to call the clinic with any problems, questions or concerns.   Martin Coder, MD 01/12/2023 9:54 AM

## 2023-01-12 NOTE — Patient Instructions (Signed)
[place] [facility]  Discharge Instructions: Thank you for choosing [facility] to provide your oncology and hematology care.  If you have a lab appointment with the Cancer Center, please go directly to the Cancer Center and check in at the registration area.  Wear comfortable clothing and clothing appropriate for easy access to any Portacath or PICC line.   We strive to give you quality time with your provider. You may need to reschedule your appointment if you arrive late (15 or more minutes).  Arriving late affects you and other patients whose appointments are after yours.  Also, if you miss three or more appointments without notifying the office, you may be dismissed from the clinic at the provider's discretion.      For prescription refill requests, have your pharmacy contact our office and allow 72 hours for refills to be completed.    Today you received the following chemotherapy and/or immunotherapy agents Carboplatin, Etoposide, Tecentriq      To help prevent nausea and vomiting after your treatment, we encourage you to take your nausea medication as directed.  BELOW ARE SYMPTOMS THAT SHOULD BE REPORTED IMMEDIATELY: *FEVER GREATER THAN 100.4 F (38 C) OR HIGHER *CHILLS OR SWEATING *NAUSEA AND VOMITING THAT IS NOT CONTROLLED WITH YOUR NAUSEA MEDICATION *UNUSUAL SHORTNESS OF BREATH *UNUSUAL BRUISING OR BLEEDING *URINARY PROBLEMS (pain or burning when urinating, or frequent urination) *BOWEL PROBLEMS (unusual diarrhea, constipation, pain near the anus) TENDERNESS IN MOUTH AND THROAT WITH OR WITHOUT PRESENCE OF ULCERS (sore throat, sores in mouth, or a toothache) UNUSUAL RASH, SWELLING OR PAIN  UNUSUAL VAGINAL DISCHARGE OR ITCHING   Items with * indicate a potential emergency and should be followed up as soon as possible or go to the Emergency Department if any problems should occur.  Please show the CHEMOTHERAPY ALERT CARD or IMMUNOTHERAPY  ALERT CARD at check-in to the Emergency Department and triage nurse.  Should you have questions after your visit or need to cancel or reschedule your appointment, please contact [place] [facility]  [phone] and follow the prompts.  Office hours are 8:00 a.m. to 4:30 p.m. Monday - Friday. Please note that voicemails left after 4:00 p.m. may not be returned until the following business day.  We are closed weekends and major holidays. You have access to a nurse at all times for urgent questions. Please call the main number to the clinic [phone] and follow the prompts.  For any non-urgent questions, you may also contact your provider using MyChart. We now offer e-Visits for anyone 54 and older to request care online for non-urgent symptoms. For details visit [email or web address].   Also download the MyChart app! Go to the app store, search "MyChart", open the app, select [place], and log in with your MyChart username and password.

## 2023-01-12 NOTE — Assessment & Plan Note (Addendum)
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 PET 2nd FEB 2024-  4.9 cm mass in the left perihilar upper lobe obstructing the apical  left upper lobe bronchi with direct mediastinal invasion and left hilar  and aortopulmonary lymphadenopathy; Bilateral adrenal nodules and masses, multiple hypoattenuating lesions  in the liver and right renal nodule concerning for metastatic disease. Pleural nodules in the posterior left pleural space measuring up to 1.5  cm compatible with pleural metastases.  Patient currently on [place] etoposide-Tecentriq every 3 weeks; recommend maintenance Tecentriq post chemotherapy.   # Proceed with cycle #2 of carbo etoposide-Tecentriq every 3 weeks of planned 4 cycles;Labs today reviewed;  acceptable for treatment today.  Response would be evaluated with interim imaging after 3 cycles.  Will order at next visit.  With Growth factor  # JAN 2024-MRI brain with and without contrast- 4 mm right parafalcine enhancing lesion favored to reflect a small meningioma; however, given the history and absence of prior studies for comparison, a dural-based metastatic lesion can not be entirely excluded.  Will repeat MRI brain today/ordered.   # Intermittent hematuria- ? Etiology [Dr.Snininski]-status post antibiotics monitor for now. stable  # Smoking: Active smoker; recommend quitting/cutting down.  # Pain- Right posterior fifth rib has an SUV max of 3.20. Right ninth posterior rib has an SUV max of 2.49. Right iliac bone lesion has an SUV max of 4; left femur lesion.  Discuss/ordered Zometa.  Check vitamin D levels.  If not better- recommend evaluation with radiation.  Percocet 7.5/325 1 to 2 pills every 12 hours.  # COPD/cough- continue with inhalers.also recommend call PCP re: nebs.   Continue Tessalon Perles/ Tussionex prn.  Possible aspiration see below .  # Hoarseness of voice/ choking spells- refer to  ENT evaluation. Discussed re:  aspiration; s/p speech path evaluation.   ? zometa # DISPOSITION: # refer to ENT re: hoarseness of voice/lung cnacer #  chemo today carbo-Etop-tecentriq/ this week #  in 1 week- APP- labs- cbc/bmp; possible IVFs over 1  hour # follow up  in 3 weeks- MD; labs- cbc/cmp; LDH; TSH; vit D 25-OH; carbo-Etop-tecentriq [new]; d-2 &3- Etop;d-4-undeyca; MRI Brain prior- Dr.B

## 2023-01-13 ENCOUNTER — Encounter: Payer: Self-pay | Admitting: Internal Medicine

## 2023-01-13 ENCOUNTER — Inpatient Hospital Stay: Payer: Medicare PPO

## 2023-01-13 ENCOUNTER — Other Ambulatory Visit: Payer: Self-pay

## 2023-01-13 VITALS — BP 132/74 | HR 85 | Temp 98.1°F | Resp 16

## 2023-01-13 DIAGNOSIS — Z5111 Encounter for antineoplastic chemotherapy: Secondary | ICD-10-CM | POA: Diagnosis not present

## 2023-01-13 DIAGNOSIS — C3412 Malignant neoplasm of upper lobe, left bronchus or lung: Secondary | ICD-10-CM

## 2023-01-13 MED ORDER — SODIUM CHLORIDE 0.9 % IV SOLN
Freq: Once | INTRAVENOUS | Status: AC
  Filled 2023-01-13: qty 250

## 2023-01-13 MED ORDER — SODIUM CHLORIDE 0.9 % IV SOLN
100.0000 mg/m2 | Freq: Once | INTRAVENOUS | Status: AC
  Administered 2023-01-13: 200 mg via INTRAVENOUS
  Filled 2023-01-13: qty 10

## 2023-01-13 MED ORDER — HEPARIN SOD (PORK) LOCK FLUSH 100 UNIT/ML IV SOLN
500.0000 [IU] | Freq: Once | INTRAVENOUS | Status: AC | PRN
  Administered 2023-01-13: 500 [IU]
  Filled 2023-01-13: qty 5

## 2023-01-13 MED ORDER — SODIUM CHLORIDE 0.9 % IV SOLN
10.0000 mg | Freq: Once | INTRAVENOUS | Status: AC
  Administered 2023-01-13: 10 mg via INTRAVENOUS
  Filled 2023-01-13: qty 10

## 2023-01-13 MED FILL — Dexamethasone Sodium Phosphate Inj 100 MG/10ML: INTRAMUSCULAR | Qty: 1 | Status: AC

## 2023-01-13 NOTE — Progress Notes (Signed)
Nutrition Follow-up:  Patient with lung cancer, stage IV metastasis to liver, adrenal, and bone.  ? Brain lesion.  Patient receiving carboplatin, etoposide and tecentriq.  Met with patient during infusion.  Reports that appetite is still doing well.  Noted evaluated by SLP and recommended regular, dysphagia 3 with thin liquids.  Coughing has improved and states pain medication helped pain and cough.  Eats well at breakfast and lunch and lighter supper meal.  Has started drinking ensure/boost shakes.      Medications: reviewed  Labs: reviewed  Anthropometrics:   Weight 179 lb 9.6 oz  175 lb on 3/18 182 lb on 3/5   NUTRITION DIAGNOSIS: Increased nutrient needs continues   INTERVENTION:  Continue oral nutrition supplement  Continue high calorie, high protein foods for weight maintenance    MONITORING, EVALUATION, GOAL: weight trends, intake   NEXT VISIT: Tuesday, April 30th during infusion  Shabree Tebbetts B. Freida Busman, RD, LDN Registered Dietitian [phone]

## 2023-01-13 NOTE — Patient Instructions (Signed)
[place] [facility] [place] [facility]  Discharge Instructions: Thank you for choosing [facility] to provide your oncology and hematology care.  If you have a lab appointment with the Cancer Center, please go directly to the Cancer Center and check in at the registration area.  Wear comfortable clothing and clothing appropriate for easy access to any Portacath or PICC line.   We strive to give you quality time with your provider. You may need to reschedule your appointment if you arrive late (15 or more minutes).  Arriving late affects you and other patients whose appointments are after yours.  Also, if you miss three or more appointments without notifying the office, you may be dismissed from the clinic at the provider's discretion.      For prescription refill requests, have your pharmacy contact our office and allow 72 hours for refills to be completed.    Today you received the following chemotherapy and/or immunotherapy agents Etoposide      To help prevent nausea and vomiting after your treatment, we encourage you to take your nausea medication as directed.  BELOW ARE SYMPTOMS THAT SHOULD BE REPORTED IMMEDIATELY: *FEVER GREATER THAN 100.4 F (38 C) OR HIGHER *CHILLS OR SWEATING *NAUSEA AND VOMITING THAT IS NOT CONTROLLED WITH YOUR NAUSEA MEDICATION *UNUSUAL SHORTNESS OF BREATH *UNUSUAL BRUISING OR BLEEDING *URINARY PROBLEMS (pain or burning when urinating, or frequent urination) *BOWEL PROBLEMS (unusual diarrhea, constipation, pain near the anus) TENDERNESS IN MOUTH AND THROAT WITH OR WITHOUT PRESENCE OF ULCERS (sore throat, sores in mouth, or a toothache) UNUSUAL RASH, SWELLING OR PAIN  UNUSUAL VAGINAL DISCHARGE OR ITCHING   Items with * indicate a potential emergency and should be followed up as soon as possible or go to the Emergency Department if any problems should occur.  Please show the CHEMOTHERAPY ALERT CARD or IMMUNOTHERAPY ALERT CARD at check-in to  the Emergency Department and triage nurse.  Should you have questions after your visit or need to cancel or reschedule your appointment, please contact [place] [facility] [place] [facility]  [phone] and follow the prompts.  Office hours are 8:00 a.m. to 4:30 p.m. Monday - Friday. Please note that voicemails left after 4:00 p.m. may not be returned until the following business day.  We are closed weekends and major holidays. You have access to a nurse at all times for urgent questions. Please call the main number to the clinic [phone] and follow the prompts.  For any non-urgent questions, you may also contact your provider using MyChart. We now offer e-Visits for anyone 18 and older to request care online for non-urgent symptoms. For details visit [email or web address].   Also download the MyChart app! Go to the app store, search "MyChart", open the app, select [place], and log in with your MyChart username and password.    

## 2023-01-14 ENCOUNTER — Inpatient Hospital Stay: Payer: Medicare PPO

## 2023-01-14 VITALS — BP 124/81 | HR 78 | Temp 99.0°F | Resp 20

## 2023-01-14 DIAGNOSIS — C3412 Malignant neoplasm of upper lobe, left bronchus or lung: Secondary | ICD-10-CM

## 2023-01-14 DIAGNOSIS — Z5111 Encounter for antineoplastic chemotherapy: Secondary | ICD-10-CM | POA: Diagnosis not present

## 2023-01-14 MED ORDER — SODIUM CHLORIDE 0.9 % IV SOLN
Freq: Once | INTRAVENOUS | Status: AC
  Filled 2023-01-14: qty 250

## 2023-01-14 MED ORDER — SODIUM CHLORIDE 0.9 % IV SOLN
100.0000 mg/m2 | Freq: Once | INTRAVENOUS | Status: AC
  Administered 2023-01-14: 200 mg via INTRAVENOUS
  Filled 2023-01-14: qty 10

## 2023-01-14 MED ORDER — HEPARIN SOD (PORK) LOCK FLUSH 100 UNIT/ML IV SOLN
500.0000 [IU] | Freq: Once | INTRAVENOUS | Status: AC | PRN
  Administered 2023-01-14: 500 [IU]
  Filled 2023-01-14: qty 5

## 2023-01-14 MED ORDER — SODIUM CHLORIDE 0.9 % IV SOLN
10.0000 mg | Freq: Once | INTRAVENOUS | Status: AC
  Administered 2023-01-14: 10 mg via INTRAVENOUS
  Filled 2023-01-14: qty 10

## 2023-01-15 ENCOUNTER — Inpatient Hospital Stay: Payer: Medicare PPO

## 2023-01-15 DIAGNOSIS — C3412 Malignant neoplasm of upper lobe, left bronchus or lung: Secondary | ICD-10-CM

## 2023-01-15 DIAGNOSIS — Z5111 Encounter for antineoplastic chemotherapy: Secondary | ICD-10-CM | POA: Diagnosis not present

## 2023-01-15 MED ORDER — PEGFILGRASTIM-CBQV 6 MG/0.6ML ~~LOC~~ SOSY
6.0000 mg | PREFILLED_SYRINGE | Freq: Once | SUBCUTANEOUS | Status: AC
  Administered 2023-01-15: 6 mg via SUBCUTANEOUS
  Filled 2023-01-15: qty 0.6

## 2023-01-20 ENCOUNTER — Other Ambulatory Visit: Payer: Self-pay | Admitting: Nurse Practitioner

## 2023-01-20 ENCOUNTER — Telehealth: Payer: Self-pay | Admitting: *Deleted

## 2023-01-20 ENCOUNTER — Other Ambulatory Visit: Payer: Self-pay | Admitting: Internal Medicine

## 2023-01-20 ENCOUNTER — Telehealth: Payer: Self-pay

## 2023-01-20 MED ORDER — OXYCODONE HCL 15 MG PO TABS: 15.0000 mg | ORAL_TABLET | Freq: Four times a day (QID) | ORAL | 0 refills | Status: DC | PRN

## 2023-01-20 NOTE — Telephone Encounter (Signed)
Pt's wife has been updated. She verbalized understanding.

## 2023-01-20 NOTE — Telephone Encounter (Signed)
Per MD ok to switch to oxy Spoke to pharmacy regarding mg. Patient switching from percocet to oxy 15mg  and will fax Rx request due to previous message that e scribe system was down. Pharmacy asked for patient to bring in other medications to discard. Patient wife aware and verbalizes understanding.

## 2023-01-20 NOTE — Progress Notes (Signed)
Prescription sent on behalf of Dr. [name]

## 2023-01-20 NOTE — Telephone Encounter (Signed)
Pt's wife called to ask if patient can switch his pain medication back to oxycodone? States that he has been trying the percocet tablets that were sent in at his last visit but pt is unable to swallow the tablets due to their size. States that he has tried to cut them in half and take with apple sauce but continues to get choked on the size of the tablet. Previously pt took 15mg  oxycodone which helped with his pain.   Please advise.

## 2023-01-20 NOTE — Progress Notes (Signed)
As per patient request changed to oxycodone 15 mg q 6 hours prn.   Josh please follow-up on patient's pain control in the next week or so.   Thank you, GB

## 2023-01-21 ENCOUNTER — Encounter: Payer: Self-pay | Admitting: Internal Medicine

## 2023-01-28 ENCOUNTER — Inpatient Hospital Stay (HOSPITAL_BASED_OUTPATIENT_CLINIC_OR_DEPARTMENT_OTHER): Payer: Medicare PPO | Admitting: Hospice and Palliative Medicine

## 2023-01-28 DIAGNOSIS — C3412 Malignant neoplasm of upper lobe, left bronchus or lung: Secondary | ICD-10-CM | POA: Diagnosis not present

## 2023-01-28 DIAGNOSIS — Z515 Encounter for palliative care: Secondary | ICD-10-CM | POA: Diagnosis not present

## 2023-01-28 DIAGNOSIS — G893 Neoplasm related pain (acute) (chronic): Secondary | ICD-10-CM | POA: Diagnosis not present

## 2023-01-28 NOTE — Progress Notes (Signed)
Virtual Visit via Telephone Note  I connected with Martin Gomez. on 01/28/23 at  1:20 PM EDT by telephone and verified that I am speaking with the correct person using two identifiers.  Location: Patient: Home Provider: Clinic   I discussed the limitations, risks, security and privacy concerns of performing an evaluation and management service by telephone and the availability of in person appointments. I also discussed with the patient that there may be a patient responsible charge related to this service. The patient expressed understanding and agreed to proceed.   History of Present Illness: Martin, Gomez. 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   Observations/Objective: Patient was referred to me to assist with pain management.  I spoke with patient by phone today and he states that his pain is currently well-controlled.  He is taking oxycodone IR 15 mg once or twice a day with good effect.  He denies any adverse effects from pain medications.  He states that he was having some constipation but that has improved with a daily stool softener.  Patient denies any other symptomatic complaints or concerns today.  Assessment and Plan: Stage IV SCLC -currently on systemic chemotherapy and reportedly symptomatically controlled  Neoplasm related pain -continue oxycodone IR.  Could consider starting a long-acting opioid if pain worsens.  Daily bowel regimen.  Follow Up Instructions: Follow-up telephone visit 1 month   I discussed the assessment and treatment plan with the patient. The patient was provided an opportunity to ask questions and all were answered. The patient agreed with the plan and demonstrated an understanding of the instructions.   The patient was advised to call back or seek an in-person evaluation if the  symptoms worsen or if the condition fails to improve as anticipated.  I provided 10 minutes of non-face-to-face time during this encounter.   Malachy Moan, NP

## 2023-01-29 ENCOUNTER — Other Ambulatory Visit: Payer: Self-pay

## 2023-01-30 ENCOUNTER — Ambulatory Visit
Admission: RE | Admit: 2023-01-30 | Discharge: 2023-01-30 | Disposition: A | Discharge status: Home / Self Care | Payer: Medicare PPO | Source: Ambulatory Visit | Attending: Internal Medicine | Admitting: Internal Medicine

## 2023-01-30 DIAGNOSIS — C3412 Malignant neoplasm of upper lobe, left bronchus or lung: Secondary | ICD-10-CM | POA: Insufficient documentation

## 2023-01-30 MED ORDER — GADOBUTROL 1 MMOL/ML IV SOLN
7.5000 mL | Freq: Once | INTRAVENOUS | Status: AC | PRN
  Administered 2023-01-30: 7.5 mL via INTRAVENOUS

## 2023-01-30 MED FILL — Dexamethasone Sodium Phosphate Inj 100 MG/10ML: INTRAMUSCULAR | Qty: 1 | Status: AC

## 2023-01-30 MED FILL — Fosaprepitant Dimeglumine For IV Infusion 150 MG (Base Eq): INTRAVENOUS | Qty: 5 | Status: AC

## 2023-02-02 ENCOUNTER — Inpatient Hospital Stay: Payer: Medicare PPO

## 2023-02-02 ENCOUNTER — Inpatient Hospital Stay (HOSPITAL_BASED_OUTPATIENT_CLINIC_OR_DEPARTMENT_OTHER): Payer: Medicare PPO | Admitting: Internal Medicine

## 2023-02-02 ENCOUNTER — Encounter: Payer: Self-pay | Admitting: Internal Medicine

## 2023-02-02 VITALS — BP 106/73 | HR 73 | Temp 96.6°F | Ht 70.0 in | Wt 177.8 lb

## 2023-02-02 DIAGNOSIS — C3412 Malignant neoplasm of upper lobe, left bronchus or lung: Secondary | ICD-10-CM | POA: Diagnosis not present

## 2023-02-02 DIAGNOSIS — J449 Chronic obstructive pulmonary disease, unspecified: Secondary | ICD-10-CM | POA: Diagnosis not present

## 2023-02-02 DIAGNOSIS — Z5111 Encounter for antineoplastic chemotherapy: Secondary | ICD-10-CM | POA: Diagnosis not present

## 2023-02-02 LAB — CBC WITH DIFFERENTIAL (CANCER CENTER ONLY)
Abs Immature Granulocytes: 0.17 10*3/uL — ABNORMAL HIGH (ref 0.00–0.07)
Basophils Absolute: 0.1 10*3/uL (ref 0.0–0.1)
Basophils Relative: 0 %
Eosinophils Absolute: 0.1 10*3/uL (ref 0.0–0.5)
Eosinophils Relative: 1 %
HCT: 34.5 % — ABNORMAL LOW (ref 39.0–52.0)
Hemoglobin: 11.5 g/dL — ABNORMAL LOW (ref 13.0–17.0)
Immature Granulocytes: 1 %
Lymphocytes Relative: 26 %
Lymphs Abs: 3.1 10*3/uL (ref 0.7–4.0)
MCH: 32.5 pg (ref 26.0–34.0)
MCHC: 33.3 g/dL (ref 30.0–36.0)
MCV: 97.5 fL (ref 80.0–100.0)
Monocytes Absolute: 1.1 10*3/uL — ABNORMAL HIGH (ref 0.1–1.0)
Monocytes Relative: 10 %
Neutro Abs: 7.3 10*3/uL (ref 1.7–7.7)
Neutrophils Relative %: 62 %
Platelet Count: 264 10*3/uL (ref 150–400)
RBC: 3.54 MIL/uL — ABNORMAL LOW (ref 4.22–5.81)
RDW: 15.9 % — ABNORMAL HIGH (ref 11.5–15.5)
WBC Count: 11.8 10*3/uL — ABNORMAL HIGH (ref 4.0–10.5)
nRBC: 0 % (ref 0.0–0.2)

## 2023-02-02 LAB — TSH: TSH: 0.945 u[IU]/mL (ref 0.350–4.500)

## 2023-02-02 LAB — CMP (CANCER CENTER ONLY)
ALT: 16 U/L (ref 0–44)
AST: 21 U/L (ref 15–41)
Albumin: 3.5 g/dL (ref 3.5–5.0)
Alkaline Phosphatase: 122 U/L (ref 38–126)
Anion gap: 6 (ref 5–15)
BUN: 15 mg/dL (ref 8–23)
CO2: 24 mmol/L (ref 22–32)
Calcium: 8.7 mg/dL — ABNORMAL LOW (ref 8.9–10.3)
Chloride: 103 mmol/L (ref 98–111)
Creatinine: 0.97 mg/dL (ref 0.61–1.24)
GFR, Estimated: >60 mL/min (ref >60)
Glucose, Bld: 109 mg/dL — ABNORMAL HIGH (ref 70–99)
Potassium: 3.7 mmol/L (ref 3.5–5.1)
Sodium: 133 mmol/L — ABNORMAL LOW (ref 135–145)
Total Bilirubin: 0.1 mg/dL — ABNORMAL LOW (ref 0.3–1.2)
Total Protein: 7 g/dL (ref 6.5–8.1)

## 2023-02-02 LAB — VITAMIN D 25 HYDROXY (VIT D DEFICIENCY, FRACTURES): Vit D, 25-Hydroxy: 67.44 ng/mL (ref 30–100)

## 2023-02-02 LAB — LACTATE DEHYDROGENASE: LDH: 128 U/L (ref 98–192)

## 2023-02-02 MED ORDER — SODIUM CHLORIDE 0.9 % IV SOLN
150.0000 mg | Freq: Once | INTRAVENOUS | Status: AC
  Administered 2023-02-02: 150 mg via INTRAVENOUS
  Filled 2023-02-02: qty 150

## 2023-02-02 MED ORDER — SODIUM CHLORIDE 0.9 % IV SOLN
Freq: Once | INTRAVENOUS | Status: AC
  Filled 2023-02-02: qty 250

## 2023-02-02 MED ORDER — HEPARIN SOD (PORK) LOCK FLUSH 100 UNIT/ML IV SOLN
500.0000 [IU] | Freq: Once | INTRAVENOUS | Status: AC | PRN
  Administered 2023-02-02: 500 [IU]
  Filled 2023-02-02: qty 5

## 2023-02-02 MED ORDER — SODIUM CHLORIDE 0.9 % IV SOLN
10.0000 mg | Freq: Once | INTRAVENOUS | Status: AC
  Administered 2023-02-02: 10 mg via INTRAVENOUS
  Filled 2023-02-02: qty 10

## 2023-02-02 MED ORDER — SODIUM CHLORIDE 0.9 % IV SOLN
100.0000 mg/m2 | Freq: Once | INTRAVENOUS | Status: AC
  Administered 2023-02-02: 200 mg via INTRAVENOUS
  Filled 2023-02-02: qty 10

## 2023-02-02 MED ORDER — PALONOSETRON HCL INJECTION 0.25 MG/5ML
0.2500 mg | Freq: Once | INTRAVENOUS | Status: AC
  Administered 2023-02-02: 0.25 mg via INTRAVENOUS
  Filled 2023-02-02: qty 5

## 2023-02-02 MED ORDER — SODIUM CHLORIDE 0.9 % IV SOLN
1200.0000 mg | Freq: Once | INTRAVENOUS | Status: AC
  Administered 2023-02-02: 1200 mg via INTRAVENOUS
  Filled 2023-02-02: qty 20

## 2023-02-02 MED ORDER — SODIUM CHLORIDE 0.9 % IV SOLN
545.0000 mg | Freq: Once | INTRAVENOUS | Status: AC
  Administered 2023-02-02: 550 mg via INTRAVENOUS
  Filled 2023-02-02: qty 55

## 2023-02-02 MED FILL — Dexamethasone Sodium Phosphate Inj 100 MG/10ML: INTRAMUSCULAR | Qty: 1 | Status: AC

## 2023-02-02 NOTE — Patient Instructions (Signed)
[place] [facility] AT [facility]  Discharge Instructions: Thank you for choosing [facility] to provide your oncology and hematology care.  If you have a lab appointment with the Cancer Center, please go directly to the Cancer Center and check in at the registration area.  Wear comfortable clothing and clothing appropriate for easy access to any Portacath or PICC line.   We strive to give you quality time with your provider. You may need to reschedule your appointment if you arrive late (15 or more minutes).  Arriving late affects you and other patients whose appointments are after yours.  Also, if you miss three or more appointments without notifying the office, you may be dismissed from the clinic at the provider's discretion.      For prescription refill requests, have your pharmacy contact our office and allow 72 hours for refills to be completed.    Today you received the following chemotherapy and/or immunotherapy agents Tecentriq, Carboplatin and Etoposide.      To help prevent nausea and vomiting after your treatment, we encourage you to take your nausea medication as directed.  BELOW ARE SYMPTOMS THAT SHOULD BE REPORTED IMMEDIATELY: *FEVER GREATER THAN 100.4 F (38 C) OR HIGHER *CHILLS OR SWEATING *NAUSEA AND VOMITING THAT IS NOT CONTROLLED WITH YOUR NAUSEA MEDICATION *UNUSUAL SHORTNESS OF BREATH *UNUSUAL BRUISING OR BLEEDING *URINARY PROBLEMS (pain or burning when urinating, or frequent urination) *BOWEL PROBLEMS (unusual diarrhea, constipation, pain near the anus) TENDERNESS IN MOUTH AND THROAT WITH OR WITHOUT PRESENCE OF ULCERS (sore throat, sores in mouth, or a toothache) UNUSUAL RASH, SWELLING OR PAIN  UNUSUAL VAGINAL DISCHARGE OR ITCHING   Items with * indicate a potential emergency and should be followed up as soon as possible or go to the Emergency Department if any problems should occur.  Please show the CHEMOTHERAPY ALERT CARD or  IMMUNOTHERAPY ALERT CARD at check-in to the Emergency Department and triage nurse.  Should you have questions after your visit or need to cancel or reschedule your appointment, please contact [place] [facility]  [phone] and follow the prompts.  Office hours are 8:00 a.m. to 4:30 p.m. Monday - Friday. Please note that voicemails left after 4:00 p.m. may not be returned until the following business day.  We are closed weekends and major holidays. You have access to a nurse at all times for urgent questions. Please call the main number to the clinic [phone] and follow the prompts.  For any non-urgent questions, you may also contact your provider using MyChart. We now offer e-Visits for anyone 7 and older to request care online for non-urgent symptoms. For details visit [email or web address].   Also download the MyChart app! Go to the app store, search "MyChart", open the app, select [place], and log in with your MyChart username and password.

## 2023-02-02 NOTE — Progress Notes (Signed)
MRI brain results.
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
## 2023-02-02 NOTE — Assessment & Plan Note (Addendum)
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 PET 2nd FEB 2024-  4.9 cm mass in the left perihilar upper lobe obstructing the apical  left upper lobe bronchi with direct mediastinal invasion and left hilar  and aortopulmonary lymphadenopathy; Bilateral adrenal nodules and masses, multiple hypoattenuating lesions  in the liver and right renal nodule concerning for metastatic disease. Pleural nodules in the posterior left pleural space measuring up to 1.5  cm compatible with pleural metastases.  Patient currently on [place] etoposide-Tecentriq every 3 weeks; recommend maintenance Tecentriq post chemotherapy.   # Proceed with cycle #3. of carbo etoposide-Tecentriq every 3 weeks of planned 4 cycles;Labs today reviewed;  acceptable for treatment today.  Response would be evaluated with interim imaging after 3 cycles.  Will order CT CAP today.  With Growth factor  # JAN 2024-MRI brain with and without contrast- 4 mm right parafalcine enhancing lesion favored to reflect a small meningioma; however, given the history and absence of prior studies for comparison, a dural-based metastatic lesion can not be entirely excluded. 01/30/2023- MRI brain-pending.   # Intermittent hematuria- ? Etiology [Dr.Snininski]-status post antibiotics monitor for now. Stable.   # Smoking: Active smoker; recommend quitting/cutting down.  # Pain- Right posterior fifth rib has an SUV max of 3.20. Right ninth posterior rib has an SUV max of 2.49. Right iliac bone lesion has an SUV max of 4; left femur lesion.  Discuss/ordered Zometa. vitamin D levels- pending.  If not better- recommend evaluation with radiation.  Percocet 7.5/325 1 to 2 pills every 12 hours.  # COPD/cough- continue with inhalers. also recommend compliance with Nebs/ inhaler.  Continue Tessalon Perles/ Tussionex prn. Will refer to pulmonary Dr.Dgyali re: COPD.    # Hoarseness of voice/ choking spells- s/p  ENT evaluation.s/p  speech path evaluation- STABLE.   ? zometa # DISPOSITION: # refer to pulmonary Dr.Dgyali re: COPD.   #  chemo today carbo-Etop-tecentriq/ this week #  in 1 week- APP- labs- cbc/bmp; possible IVFs over 1  hour # follow up  in 3 weeks- MD; labs- cbc/cmp; LDH; TSH; carbo-Etop-tecentriq [new]; d-2 &3- Etop;d-4-undeyca; CT CAP prior- Dr.B

## 2023-02-03 ENCOUNTER — Inpatient Hospital Stay: Payer: Medicare PPO

## 2023-02-03 VITALS — BP 127/67 | HR 91 | Temp 97.4°F | Resp 18

## 2023-02-03 DIAGNOSIS — C3412 Malignant neoplasm of upper lobe, left bronchus or lung: Secondary | ICD-10-CM

## 2023-02-03 DIAGNOSIS — Z5111 Encounter for antineoplastic chemotherapy: Secondary | ICD-10-CM | POA: Diagnosis not present

## 2023-02-03 LAB — T4: T4, Total: 8.1 ug/dL (ref 4.5–12.0)

## 2023-02-03 MED ORDER — SODIUM CHLORIDE 0.9 % IV SOLN
10.0000 mg | Freq: Once | INTRAVENOUS | Status: AC
  Administered 2023-02-03: 10 mg via INTRAVENOUS
  Filled 2023-02-03: qty 10

## 2023-02-03 MED ORDER — SODIUM CHLORIDE 0.9 % IV SOLN
100.0000 mg/m2 | Freq: Once | INTRAVENOUS | Status: AC
  Administered 2023-02-03: 200 mg via INTRAVENOUS
  Filled 2023-02-03: qty 10

## 2023-02-03 MED ORDER — SODIUM CHLORIDE 0.9 % IV SOLN
Freq: Once | INTRAVENOUS | Status: AC
  Filled 2023-02-03: qty 250

## 2023-02-03 MED FILL — Dexamethasone Sodium Phosphate Inj 100 MG/10ML: INTRAMUSCULAR | Qty: 1 | Status: AC

## 2023-02-03 NOTE — Patient Instructions (Signed)
[facility] [place] [facility]  Discharge Instructions: Thank you for choosing [facility] to provide your oncology and hematology care.  If you have a lab appointment with the Cancer Center, please go directly to the Cancer Center and check in at the registration area.  Wear comfortable clothing and clothing appropriate for easy access to any Portacath or PICC line.   We strive to give you quality time with your provider. You may need to reschedule your appointment if you arrive late (15 or more minutes).  Arriving late affects you and other patients whose appointments are after yours.  Also, if you miss three or more appointments without notifying the office, you may be dismissed from the clinic at the provider's discretion.      For prescription refill requests, have your pharmacy contact our office and allow 72 hours for refills to be completed.    Today you received the following chemotherapy and/or immunotherapy agents: etoposide     To help prevent nausea and vomiting after your treatment, we encourage you to take your nausea medication as directed.  BELOW ARE SYMPTOMS THAT SHOULD BE REPORTED IMMEDIATELY: *FEVER GREATER THAN 100.4 F (38 C) OR HIGHER *CHILLS OR SWEATING *NAUSEA AND VOMITING THAT IS NOT CONTROLLED WITH YOUR NAUSEA MEDICATION *UNUSUAL SHORTNESS OF BREATH *UNUSUAL BRUISING OR BLEEDING *URINARY PROBLEMS (pain or burning when urinating, or frequent urination) *BOWEL PROBLEMS (unusual diarrhea, constipation, pain near the anus) TENDERNESS IN MOUTH AND THROAT WITH OR WITHOUT PRESENCE OF ULCERS (sore throat, sores in mouth, or a toothache) UNUSUAL RASH, SWELLING OR PAIN  UNUSUAL VAGINAL DISCHARGE OR ITCHING   Items with * indicate a potential emergency and should be followed up as soon as possible or go to the Emergency Department if any problems should occur.  Please show the CHEMOTHERAPY ALERT CARD or IMMUNOTHERAPY ALERT CARD at check-in to  the Emergency Department and triage nurse.  Should you have questions after your visit or need to cancel or reschedule your appointment, please contact [facility] [place] [facility]  [phone] and follow the prompts.  Office hours are 8:00 a.m. to 4:30 p.m. Monday - Friday. Please note that voicemails left after 4:00 p.m. may not be returned until the following business day.  We are closed weekends and major holidays. You have access to a nurse at all times for urgent questions. Please call the main number to the clinic [phone] and follow the prompts.  For any non-urgent questions, you may also contact your provider using MyChart. We now offer e-Visits for anyone 18 and older to request care online for non-urgent symptoms. For details visit [email or web address].   Also download the MyChart app! Go to the app store, search "MyChart", open the app, select [place], and log in with your MyChart username and password.    

## 2023-02-03 NOTE — Progress Notes (Signed)
Nutrition Follow-up:  Patient with lung cancer, stage IV metastasis to liver, adrenal and bone, brain lesion possible. Receiving chemotherapy.   Met with patient during infusion. Reports that appetite is overall good. Has had some days when appetite not as strong and taste and smell has been off.  Food at times has medicine taste/metallic taste.  Drinking boost/ensure daily.      Medications: reviewed  Labs: reviewed  Anthropometrics:   Weight 177 lb 12.8 oz on 4/29 179 lb 9.6 oz on 4/9 175 lb on 3/18 182 lb on 3/5   NUTRITION DIAGNOSIS: Increased nutrient needs continue   INTERVENTION:  Continue oral nutrition supplement Continue high calorie, high protein foods to maintain weight    MONITORING, EVALUATION, GOAL: weight trends, intake   NEXT VISIT: as needed  Vertie Dibbern B. Freida Busman, RD, LDN Registered Dietitian [phone]

## 2023-02-04 ENCOUNTER — Inpatient Hospital Stay: Payer: Medicare PPO | Attending: Internal Medicine

## 2023-02-04 VITALS — BP 122/72 | HR 79 | Temp 97.9°F | Resp 16

## 2023-02-04 DIAGNOSIS — C7931 Secondary malignant neoplasm of brain: Secondary | ICD-10-CM | POA: Insufficient documentation

## 2023-02-04 DIAGNOSIS — C3412 Malignant neoplasm of upper lobe, left bronchus or lung: Secondary | ICD-10-CM | POA: Insufficient documentation

## 2023-02-04 DIAGNOSIS — F17210 Nicotine dependence, cigarettes, uncomplicated: Secondary | ICD-10-CM | POA: Insufficient documentation

## 2023-02-04 DIAGNOSIS — C7951 Secondary malignant neoplasm of bone: Secondary | ICD-10-CM | POA: Insufficient documentation

## 2023-02-04 DIAGNOSIS — Z5111 Encounter for antineoplastic chemotherapy: Secondary | ICD-10-CM | POA: Insufficient documentation

## 2023-02-04 DIAGNOSIS — C787 Secondary malignant neoplasm of liver and intrahepatic bile duct: Secondary | ICD-10-CM | POA: Insufficient documentation

## 2023-02-04 DIAGNOSIS — Z79899 Other long term (current) drug therapy: Secondary | ICD-10-CM | POA: Insufficient documentation

## 2023-02-04 MED ORDER — SODIUM CHLORIDE 0.9 % IV SOLN
100.0000 mg/m2 | Freq: Once | INTRAVENOUS | Status: AC
  Administered 2023-02-04: 200 mg via INTRAVENOUS
  Filled 2023-02-04: qty 10

## 2023-02-04 MED ORDER — SODIUM CHLORIDE 0.9% FLUSH
10.0000 mL | INTRAVENOUS | Status: DC | PRN
  Filled 2023-02-04: qty 10

## 2023-02-04 MED ORDER — HEPARIN SOD (PORK) LOCK FLUSH 100 UNIT/ML IV SOLN
500.0000 [IU] | Freq: Once | INTRAVENOUS | Status: AC | PRN
  Administered 2023-02-04: 500 [IU]
  Filled 2023-02-04: qty 5

## 2023-02-04 MED ORDER — SODIUM CHLORIDE 0.9 % IV SOLN
10.0000 mg | Freq: Once | INTRAVENOUS | Status: AC
  Administered 2023-02-04: 10 mg via INTRAVENOUS
  Filled 2023-02-04: qty 10

## 2023-02-04 MED ORDER — SODIUM CHLORIDE 0.9 % IV SOLN
Freq: Once | INTRAVENOUS | Status: AC
  Filled 2023-02-04: qty 250

## 2023-02-04 NOTE — Patient Instructions (Signed)
[facility] [place] [facility]  Discharge Instructions: Thank you for choosing [facility] to provide your oncology and hematology care.  If you have a lab appointment with the Cancer Center, please go directly to the Cancer Center and check in at the registration area.  Wear comfortable clothing and clothing appropriate for easy access to any Portacath or PICC line.   We strive to give you quality time with your provider. You may need to reschedule your appointment if you arrive late (15 or more minutes).  Arriving late affects you and other patients whose appointments are after yours.  Also, if you miss three or more appointments without notifying the office, you may be dismissed from the clinic at the provider's discretion.      For prescription refill requests, have your pharmacy contact our office and allow 72 hours for refills to be completed.    Today you received the following chemotherapy and/or immunotherapy agents: etoposide     To help prevent nausea and vomiting after your treatment, we encourage you to take your nausea medication as directed.  BELOW ARE SYMPTOMS THAT SHOULD BE REPORTED IMMEDIATELY: *FEVER GREATER THAN 100.4 F (38 C) OR HIGHER *CHILLS OR SWEATING *NAUSEA AND VOMITING THAT IS NOT CONTROLLED WITH YOUR NAUSEA MEDICATION *UNUSUAL SHORTNESS OF BREATH *UNUSUAL BRUISING OR BLEEDING *URINARY PROBLEMS (pain or burning when urinating, or frequent urination) *BOWEL PROBLEMS (unusual diarrhea, constipation, pain near the anus) TENDERNESS IN MOUTH AND THROAT WITH OR WITHOUT PRESENCE OF ULCERS (sore throat, sores in mouth, or a toothache) UNUSUAL RASH, SWELLING OR PAIN  UNUSUAL VAGINAL DISCHARGE OR ITCHING   Items with * indicate a potential emergency and should be followed up as soon as possible or go to the Emergency Department if any problems should occur.  Please show the CHEMOTHERAPY ALERT CARD or IMMUNOTHERAPY ALERT CARD at check-in to  the Emergency Department and triage nurse.  Should you have questions after your visit or need to cancel or reschedule your appointment, please contact [facility] [facility]  [phone] and follow the prompts.  Office hours are 8:00 a.m. to 4:30 p.m. Monday - Friday. Please note that voicemails left after 4:00 p.m. may not be returned until the following business day.  We are closed weekends and major holidays. You have access to a nurse at all times for urgent questions. Please call the main number to the clinic [phone] and follow the prompts.  For any non-urgent questions, you may also contact your provider using MyChart. We now offer e-Visits for anyone 18 and older to request care online for non-urgent symptoms. For details visit [email or web address].   Also download the MyChart app! Go to the app store, search "MyChart", open the app, select [facility] [facility], and log in with your MyChart username and password.    

## 2023-02-06 ENCOUNTER — Inpatient Hospital Stay: Payer: Medicare PPO

## 2023-02-06 DIAGNOSIS — C3412 Malignant neoplasm of upper lobe, left bronchus or lung: Secondary | ICD-10-CM

## 2023-02-06 DIAGNOSIS — Z5111 Encounter for antineoplastic chemotherapy: Secondary | ICD-10-CM | POA: Diagnosis not present

## 2023-02-06 MED ORDER — PEGFILGRASTIM-CBQV 6 MG/0.6ML ~~LOC~~ SOSY
6.0000 mg | PREFILLED_SYRINGE | Freq: Once | SUBCUTANEOUS | Status: AC
  Administered 2023-02-06: 6 mg via SUBCUTANEOUS
  Filled 2023-02-06: qty 0.6

## 2023-02-09 ENCOUNTER — Inpatient Hospital Stay (HOSPITAL_BASED_OUTPATIENT_CLINIC_OR_DEPARTMENT_OTHER): Payer: Medicare PPO | Admitting: Nurse Practitioner

## 2023-02-09 ENCOUNTER — Inpatient Hospital Stay: Payer: Medicare PPO

## 2023-02-09 ENCOUNTER — Encounter: Payer: Self-pay | Admitting: Nurse Practitioner

## 2023-02-09 VITALS — BP 89/63 | HR 75 | Temp 97.0°F | Wt 178.0 lb

## 2023-02-09 DIAGNOSIS — E861 Hypovolemia: Secondary | ICD-10-CM

## 2023-02-09 DIAGNOSIS — Z5111 Encounter for antineoplastic chemotherapy: Secondary | ICD-10-CM | POA: Diagnosis not present

## 2023-02-09 DIAGNOSIS — Z09 Encounter for follow-up examination after completed treatment for conditions other than malignant neoplasm: Secondary | ICD-10-CM

## 2023-02-09 DIAGNOSIS — G893 Neoplasm related pain (acute) (chronic): Secondary | ICD-10-CM | POA: Diagnosis not present

## 2023-02-09 DIAGNOSIS — C3412 Malignant neoplasm of upper lobe, left bronchus or lung: Secondary | ICD-10-CM

## 2023-02-09 LAB — CBC WITH DIFFERENTIAL (CANCER CENTER ONLY)
Abs Immature Granulocytes: 1.84 10*3/uL — ABNORMAL HIGH (ref 0.00–0.07)
Basophils Absolute: 0 10*3/uL (ref 0.0–0.1)
Basophils Relative: 0 %
Eosinophils Absolute: 0 10*3/uL (ref 0.0–0.5)
Eosinophils Relative: 0 %
HCT: 31.4 % — ABNORMAL LOW (ref 39.0–52.0)
Hemoglobin: 10.4 g/dL — ABNORMAL LOW (ref 13.0–17.0)
Immature Granulocytes: 6 %
Lymphocytes Relative: 9 %
Lymphs Abs: 2.7 10*3/uL (ref 0.7–4.0)
MCH: 32.6 pg (ref 26.0–34.0)
MCHC: 33.1 g/dL (ref 30.0–36.0)
MCV: 98.4 fL (ref 80.0–100.0)
Monocytes Absolute: 0.5 10*3/uL (ref 0.1–1.0)
Monocytes Relative: 2 %
Neutro Abs: 25.9 10*3/uL — ABNORMAL HIGH (ref 1.7–7.7)
Neutrophils Relative %: 83 %
Platelet Count: 235 10*3/uL (ref 150–400)
RBC: 3.19 MIL/uL — ABNORMAL LOW (ref 4.22–5.81)
RDW: 16.3 % — ABNORMAL HIGH (ref 11.5–15.5)
Smear Review: NORMAL
WBC Count: 31.1 10*3/uL — ABNORMAL HIGH (ref 4.0–10.5)
nRBC: 0 % (ref 0.0–0.2)

## 2023-02-09 LAB — BASIC METABOLIC PANEL - CANCER CENTER ONLY
Anion gap: 8 (ref 5–15)
BUN: 23 mg/dL (ref 8–23)
CO2: 23 mmol/L (ref 22–32)
Calcium: 8.7 mg/dL — ABNORMAL LOW (ref 8.9–10.3)
Chloride: 106 mmol/L (ref 98–111)
Creatinine: 0.91 mg/dL (ref 0.61–1.24)
GFR, Estimated: >60 mL/min (ref >60)
Glucose, Bld: 143 mg/dL — ABNORMAL HIGH (ref 70–99)
Potassium: 4 mmol/L (ref 3.5–5.1)
Sodium: 137 mmol/L (ref 135–145)

## 2023-02-09 MED ORDER — SODIUM CHLORIDE 0.9 % IV SOLN
Freq: Once | INTRAVENOUS | Status: AC
  Filled 2023-02-09: qty 250

## 2023-02-09 MED ORDER — HEPARIN SOD (PORK) LOCK FLUSH 100 UNIT/ML IV SOLN
500.0000 [IU] | Freq: Once | INTRAVENOUS | Status: AC
  Administered 2023-02-09: 500 [IU] via INTRAVENOUS
  Filled 2023-02-09: qty 5

## 2023-02-09 MED ORDER — SODIUM CHLORIDE 0.9% FLUSH
10.0000 mL | Freq: Once | INTRAVENOUS | Status: AC
  Administered 2023-02-09: 10 mL via INTRAVENOUS
  Filled 2023-02-09: qty 10
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
## 2023-02-09 NOTE — Progress Notes (Signed)
Left message for pt to give us a call back

## 2023-02-11 NOTE — Progress Notes (Signed)
Pt's wife notified.

## 2023-02-18 ENCOUNTER — Ambulatory Visit
Admission: RE | Admit: 2023-02-18 | Discharge: 2023-02-18 | Disposition: A | Discharge status: Home / Self Care | Payer: Medicare PPO | Source: Ambulatory Visit | Attending: Internal Medicine | Admitting: Internal Medicine

## 2023-02-18 ENCOUNTER — Other Ambulatory Visit: Payer: Self-pay

## 2023-02-18 DIAGNOSIS — C3412 Malignant neoplasm of upper lobe, left bronchus or lung: Secondary | ICD-10-CM | POA: Diagnosis present

## 2023-02-18 MED ORDER — IOHEXOL 300 MG/ML  SOLN
100.0000 mL | Freq: Once | INTRAMUSCULAR | Status: AC | PRN
  Administered 2023-02-18: 100 mL via INTRAVENOUS

## 2023-02-20 MED FILL — Fosaprepitant Dimeglumine For IV Infusion 150 MG (Base Eq): INTRAVENOUS | Qty: 5 | Status: AC

## 2023-02-20 MED FILL — Dexamethasone Sodium Phosphate Inj 100 MG/10ML: INTRAMUSCULAR | Qty: 1 | Status: AC

## 2023-02-23 ENCOUNTER — Inpatient Hospital Stay: Payer: Medicare PPO

## 2023-02-23 ENCOUNTER — Encounter: Payer: Self-pay | Admitting: Internal Medicine

## 2023-02-23 ENCOUNTER — Inpatient Hospital Stay (HOSPITAL_BASED_OUTPATIENT_CLINIC_OR_DEPARTMENT_OTHER): Payer: Medicare PPO | Admitting: Internal Medicine

## 2023-02-23 ENCOUNTER — Other Ambulatory Visit: Payer: Self-pay

## 2023-02-23 ENCOUNTER — Telehealth: Payer: Self-pay | Admitting: *Deleted

## 2023-02-23 VITALS — BP 113/83 | HR 72 | Temp 97.9°F | Resp 16 | Wt 175.0 lb

## 2023-02-23 DIAGNOSIS — J449 Chronic obstructive pulmonary disease, unspecified: Secondary | ICD-10-CM | POA: Diagnosis not present

## 2023-02-23 DIAGNOSIS — C3412 Malignant neoplasm of upper lobe, left bronchus or lung: Secondary | ICD-10-CM

## 2023-02-23 DIAGNOSIS — Z5111 Encounter for antineoplastic chemotherapy: Secondary | ICD-10-CM | POA: Diagnosis not present

## 2023-02-23 LAB — CMP (CANCER CENTER ONLY)
ALT: 13 U/L (ref 0–44)
AST: 18 U/L (ref 15–41)
Albumin: 3.6 g/dL (ref 3.5–5.0)
Alkaline Phosphatase: 120 U/L (ref 38–126)
Anion gap: 9 (ref 5–15)
BUN: 15 mg/dL (ref 8–23)
CO2: 23 mmol/L (ref 22–32)
Calcium: 9.1 mg/dL (ref 8.9–10.3)
Chloride: 104 mmol/L (ref 98–111)
Creatinine: 0.93 mg/dL (ref 0.61–1.24)
GFR, Estimated: >60 mL/min (ref >60)
Glucose, Bld: 115 mg/dL — ABNORMAL HIGH (ref 70–99)
Potassium: 3.6 mmol/L (ref 3.5–5.1)
Sodium: 136 mmol/L (ref 135–145)
Total Bilirubin: 0.5 mg/dL (ref 0.3–1.2)
Total Protein: 7.1 g/dL (ref 6.5–8.1)

## 2023-02-23 LAB — CBC WITH DIFFERENTIAL (CANCER CENTER ONLY)
Abs Immature Granulocytes: 0.09 10*3/uL — ABNORMAL HIGH (ref 0.00–0.07)
Basophils Absolute: 0 10*3/uL (ref 0.0–0.1)
Basophils Relative: 0 %
Eosinophils Absolute: 0.1 10*3/uL (ref 0.0–0.5)
Eosinophils Relative: 1 %
HCT: 33.2 % — ABNORMAL LOW (ref 39.0–52.0)
Hemoglobin: 10.8 g/dL — ABNORMAL LOW (ref 13.0–17.0)
Immature Granulocytes: 1 %
Lymphocytes Relative: 26 %
Lymphs Abs: 2.8 10*3/uL (ref 0.7–4.0)
MCH: 32.1 pg (ref 26.0–34.0)
MCHC: 32.5 g/dL (ref 30.0–36.0)
MCV: 98.8 fL (ref 80.0–100.0)
Monocytes Absolute: 1.1 10*3/uL — ABNORMAL HIGH (ref 0.1–1.0)
Monocytes Relative: 10 %
Neutro Abs: 6.8 10*3/uL (ref 1.7–7.7)
Neutrophils Relative %: 62 %
Platelet Count: 226 10*3/uL (ref 150–400)
RBC: 3.36 MIL/uL — ABNORMAL LOW (ref 4.22–5.81)
RDW: 17.7 % — ABNORMAL HIGH (ref 11.5–15.5)
WBC Count: 10.8 10*3/uL — ABNORMAL HIGH (ref 4.0–10.5)
nRBC: 0 % (ref 0.0–0.2)

## 2023-02-23 LAB — LACTATE DEHYDROGENASE: LDH: 124 U/L (ref 98–192)

## 2023-02-23 LAB — TSH: TSH: 0.015 u[IU]/mL — ABNORMAL LOW (ref 0.350–4.500)

## 2023-02-23 MED ORDER — LEVOFLOXACIN 500 MG PO TABS: 500.0000 mg | ORAL_TABLET | Freq: Every day | ORAL | 0 refills | Status: DC

## 2023-02-23 MED ORDER — OXYCODONE HCL 15 MG PO TABS: 15.0000 mg | ORAL_TABLET | Freq: Three times a day (TID) | ORAL | 0 refills | Status: DC | PRN

## 2023-02-23 MED ORDER — METHYLPREDNISOLONE 4 MG PO TBPK: ORAL_TABLET | ORAL | 1 refills | Status: DC

## 2023-02-23 MED ORDER — HEPARIN SOD (PORK) LOCK FLUSH 100 UNIT/ML IV SOLN
500.0000 [IU] | Freq: Once | INTRAVENOUS | Status: AC
  Administered 2023-02-23: 500 [IU] via INTRAVENOUS
  Filled 2023-02-23: qty 5

## 2023-02-23 MED FILL — Dexamethasone Sodium Phosphate Inj 100 MG/10ML: INTRAMUSCULAR | Qty: 1 | Status: AC

## 2023-02-23 NOTE — Telephone Encounter (Signed)
2nd referral placed in epic for internal referral to [facility] Dr Aundria Rud. Refrral also faxed to office.
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
## 2023-02-23 NOTE — Assessment & Plan Note (Addendum)
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 PET 2nd FEB 2024-  4.9 cm mass in the left perihilar upper lobe obstructing the apical  left upper lobe bronchi with direct mediastinal invasion and left hilar  and aortopulmonary lymphadenopathy; Bilateral adrenal nodules and masses, multiple hypoattenuating lesions  in the liver and right renal nodule concerning for metastatic disease. Pleural nodules in the posterior left pleural space measuring up to 1.5  cm compatible with pleural metastases.  Patient currently on [place] etoposide-Tecentriq every 3 weeks; recommend maintenance Tecentriq post chemotherapy.  # Status post 3 cycles of chemoimmunotherapy- MAY 15th, 2024- Moderate to marked response to therapy of central left upper lobe primary bronchogenic carcinoma, pleural, nodal and hepatic metastasis. Interval sclerosis of previously CT occult osseous metastasis, presumably secondary to interval healing. New pathologic fracture involving the posterior right fifth rib.  New left apical lung mass and adjacent nodule. Given response to therapy elsewhere, and presence of subtending bronchial narrowing/obstruction, favor postobstructive infectious etiology. Mixed response to therapy with isolated progression of pulmonary metastasis felt less likely.  # HOLD  cycle #4. of carbo etoposide-Tecentriq every 3 weeks of planned 4 cycles;- given the left upper lobe new infiltrate- see below. Plan to re-start in 2 weeks.   # left upper lobe new infiltrate- ? Post obstructive pneumonia- plan levaquin 500 mg/da x7 days; and prednisone taper.   # JAN 2024-MRI brain with and without contrast- 4 mm right parafalcine enhancing lesion favored to reflect a small meningioma; however, given the history and absence of prior studies for comparison, a dural-based metastatic lesion can not be entirely excluded. 01/30/2023- MRI brain-resolved.   # Intermittent hematuria- ? Etiology  [Dr.Snininski]-status post antibiotics monitor for now. Stable.   # Smoking: Active smoker; recommend quitting/cutting down.  # Pain- Right posterior fifth rib has an SUV max of 3.20. Right ninth posterior rib has an SUV max of 2.49. Right iliac bone lesion has an SUV max of 4; left femur lesion.  Discuss/ordered Zometa. vitamin D levels- pending.  If not better- recommend evaluation with radiation.  Percocet 7.5/325 1 to 2 pills every 12 hours.  # COPD/cough- continue with inhalers. also recommend compliance with Nebs/ inhaler.  Continue Tessalon Perles/ Tussionex prn. Will refer to pulmonary Dr.Dgyali re: COPD.    # Hoarseness of voice/ choking spells- s/p  ENT evaluation.s/p speech path evaluation- STABLE.   #Incidental findings on Imaging  CT , 2024: 4. Aortic atherosclerosis (ICD10-I70.0), coronary artery atherosclerosis and emphysema (ICD10-J43.9).I reviewed/discussed/counseled the patient.   ? zometa # DISPOSITION: # refer to pulmonary Dr.Dgyali re: COPD- second referral  #  HOLD chemo today carbo-Etop-tecentriq/ this week # follow up  in 2 weeks- MD; labs- cbc/cmp; LDH; TSH; carbo-Etop-tecentriq [new]; d-2 &3- Etop;d-4-undeyca; Dr.B  # I reviewed the blood work- with the patient in detail; also reviewed the imaging independently [as summarized above]; and with the patient in detail.

## 2023-02-23 NOTE — Progress Notes (Signed)
Patient here for oncology follow-up appointment, concerns of diarrhea   

## 2023-02-24 ENCOUNTER — Inpatient Hospital Stay: Payer: Medicare PPO

## 2023-02-24 ENCOUNTER — Other Ambulatory Visit: Payer: Self-pay

## 2023-02-25 ENCOUNTER — Inpatient Hospital Stay: Payer: Medicare PPO

## 2023-02-25 ENCOUNTER — Encounter: Payer: Self-pay | Admitting: Internal Medicine

## 2023-02-26 ENCOUNTER — Encounter: Payer: Self-pay | Admitting: Student in an Organized Health Care Education/Training Program

## 2023-02-26 ENCOUNTER — Ambulatory Visit (INDEPENDENT_AMBULATORY_CARE_PROVIDER_SITE_OTHER): Payer: Medicare PPO | Admitting: Student in an Organized Health Care Education/Training Program

## 2023-02-26 VITALS — BP 128/80 | HR 84 | Temp 97.8°F | Ht 70.0 in | Wt 174.0 lb

## 2023-02-26 DIAGNOSIS — J42 Unspecified chronic bronchitis: Secondary | ICD-10-CM | POA: Diagnosis not present

## 2023-02-26 DIAGNOSIS — C3490 Malignant neoplasm of unspecified part of unspecified bronchus or lung: Secondary | ICD-10-CM | POA: Diagnosis not present

## 2023-02-26 MED ORDER — BREZTRI AEROSPHERE 160-9-4.8 MCG/ACT IN AERO
2.0000 | INHALATION_SPRAY | Freq: Two times a day (BID) | RESPIRATORY_TRACT | 11 refills | Status: DC
Start: 2023-02-26 — End: 2023-03-13

## 2023-02-26 MED ORDER — IPRATROPIUM-ALBUTEROL 0.5-2.5 (3) MG/3ML IN SOLN
3.0000 mL | RESPIRATORY_TRACT | 6 refills | Status: AC | PRN
Start: 2023-02-26 — End: ?

## 2023-02-26 MED ORDER — BREZTRI AEROSPHERE 160-9-4.8 MCG/ACT IN AERO: 2.0000 | INHALATION_SPRAY | Freq: Two times a day (BID) | RESPIRATORY_TRACT | 0 refills | Status: DC
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
## 2023-02-27 ENCOUNTER — Ambulatory Visit: Payer: Medicare PPO

## 2023-02-27 ENCOUNTER — Inpatient Hospital Stay (HOSPITAL_BASED_OUTPATIENT_CLINIC_OR_DEPARTMENT_OTHER): Payer: Medicare PPO | Admitting: Hospice and Palliative Medicine

## 2023-02-27 DIAGNOSIS — C3412 Malignant neoplasm of upper lobe, left bronchus or lung: Secondary | ICD-10-CM

## 2023-02-27 NOTE — Progress Notes (Signed)
Voicemail left.  Will reschedule. °

## 2023-02-28 ENCOUNTER — Other Ambulatory Visit: Payer: Self-pay

## 2023-03-05 MED FILL — Dexamethasone Sodium Phosphate Inj 100 MG/10ML: INTRAMUSCULAR | Qty: 1 | Status: AC

## 2023-03-06 ENCOUNTER — Other Ambulatory Visit: Payer: Self-pay

## 2023-03-06 MED FILL — Fosaprepitant Dimeglumine For IV Infusion 150 MG (Base Eq): INTRAVENOUS | Qty: 5 | Status: AC

## 2023-03-09 ENCOUNTER — Inpatient Hospital Stay: Payer: Medicare PPO

## 2023-03-09 ENCOUNTER — Encounter: Payer: Self-pay | Admitting: Internal Medicine

## 2023-03-09 ENCOUNTER — Inpatient Hospital Stay: Payer: Medicare PPO | Attending: Internal Medicine | Admitting: Internal Medicine

## 2023-03-09 VITALS — BP 112/73 | HR 89 | Temp 97.7°F | Ht 70.0 in | Wt 171.0 lb

## 2023-03-09 DIAGNOSIS — C787 Secondary malignant neoplasm of liver and intrahepatic bile duct: Secondary | ICD-10-CM | POA: Diagnosis not present

## 2023-03-09 DIAGNOSIS — J189 Pneumonia, unspecified organism: Secondary | ICD-10-CM | POA: Insufficient documentation

## 2023-03-09 DIAGNOSIS — J432 Centrilobular emphysema: Secondary | ICD-10-CM | POA: Diagnosis not present

## 2023-03-09 DIAGNOSIS — Z5111 Encounter for antineoplastic chemotherapy: Secondary | ICD-10-CM | POA: Insufficient documentation

## 2023-03-09 DIAGNOSIS — C3412 Malignant neoplasm of upper lobe, left bronchus or lung: Secondary | ICD-10-CM | POA: Diagnosis present

## 2023-03-09 DIAGNOSIS — C7951 Secondary malignant neoplasm of bone: Secondary | ICD-10-CM | POA: Insufficient documentation

## 2023-03-09 DIAGNOSIS — J440 Chronic obstructive pulmonary disease with acute lower respiratory infection: Secondary | ICD-10-CM | POA: Insufficient documentation

## 2023-03-09 DIAGNOSIS — E042 Nontoxic multinodular goiter: Secondary | ICD-10-CM | POA: Insufficient documentation

## 2023-03-09 DIAGNOSIS — Z79899 Other long term (current) drug therapy: Secondary | ICD-10-CM | POA: Diagnosis not present

## 2023-03-09 LAB — CMP (CANCER CENTER ONLY)
ALT: 16 U/L (ref 0–44)
AST: 17 U/L (ref 15–41)
Albumin: 3.6 g/dL (ref 3.5–5.0)
Alkaline Phosphatase: 97 U/L (ref 38–126)
Anion gap: 6 (ref 5–15)
BUN: 18 mg/dL (ref 8–23)
CO2: 24 mmol/L (ref 22–32)
Calcium: 9.2 mg/dL (ref 8.9–10.3)
Chloride: 107 mmol/L (ref 98–111)
Creatinine: 0.92 mg/dL (ref 0.61–1.24)
GFR, Estimated: >60 mL/min (ref >60)
Glucose, Bld: 110 mg/dL — ABNORMAL HIGH (ref 70–99)
Potassium: 3.6 mmol/L (ref 3.5–5.1)
Sodium: 137 mmol/L (ref 135–145)
Total Bilirubin: 0.4 mg/dL (ref 0.3–1.2)
Total Protein: 7 g/dL (ref 6.5–8.1)

## 2023-03-09 LAB — CBC WITH DIFFERENTIAL (CANCER CENTER ONLY)
Abs Immature Granulocytes: 0.02 10*3/uL (ref 0.00–0.07)
Basophils Absolute: 0 10*3/uL (ref 0.0–0.1)
Basophils Relative: 0 %
Eosinophils Absolute: 0.1 10*3/uL (ref 0.0–0.5)
Eosinophils Relative: 2 %
HCT: 34.9 % — ABNORMAL LOW (ref 39.0–52.0)
Hemoglobin: 11.5 g/dL — ABNORMAL LOW (ref 13.0–17.0)
Immature Granulocytes: 0 %
Lymphocytes Relative: 31 %
Lymphs Abs: 2.9 10*3/uL (ref 0.7–4.0)
MCH: 32.4 pg (ref 26.0–34.0)
MCHC: 33 g/dL (ref 30.0–36.0)
MCV: 98.3 fL (ref 80.0–100.0)
Monocytes Absolute: 1.3 10*3/uL — ABNORMAL HIGH (ref 0.1–1.0)
Monocytes Relative: 14 %
Neutro Abs: 4.9 10*3/uL (ref 1.7–7.7)
Neutrophils Relative %: 53 %
Platelet Count: 241 10*3/uL (ref 150–400)
RBC: 3.55 MIL/uL — ABNORMAL LOW (ref 4.22–5.81)
RDW: 17 % — ABNORMAL HIGH (ref 11.5–15.5)
WBC Count: 9.3 10*3/uL (ref 4.0–10.5)
nRBC: 0 % (ref 0.0–0.2)

## 2023-03-09 LAB — LACTATE DEHYDROGENASE: LDH: 127 U/L (ref 98–192)

## 2023-03-09 LAB — TSH: TSH: <0.01 u[IU]/mL — ABNORMAL LOW (ref 0.350–4.500)

## 2023-03-09 MED ORDER — SODIUM CHLORIDE 0.9 % IV SOLN
100.0000 mg/m2 | Freq: Once | INTRAVENOUS | Status: AC
  Administered 2023-03-09: 200 mg via INTRAVENOUS
  Filled 2023-03-09: qty 10

## 2023-03-09 MED ORDER — PALONOSETRON HCL INJECTION 0.25 MG/5ML
0.2500 mg | Freq: Once | INTRAVENOUS | Status: AC
  Administered 2023-03-09: 0.25 mg via INTRAVENOUS
  Filled 2023-03-09: qty 5

## 2023-03-09 MED ORDER — SODIUM CHLORIDE 0.9 % IV SOLN
Freq: Once | INTRAVENOUS | Status: AC
  Filled 2023-03-09: qty 250

## 2023-03-09 MED ORDER — SODIUM CHLORIDE 0.9 % IV SOLN
1200.0000 mg | Freq: Once | INTRAVENOUS | Status: AC
  Administered 2023-03-09: 1200 mg via INTRAVENOUS
  Filled 2023-03-09: qty 20

## 2023-03-09 MED ORDER — SODIUM CHLORIDE 0.9% FLUSH
10.0000 mL | INTRAVENOUS | Status: DC | PRN
  Administered 2023-03-09: 10 mL via INTRAVENOUS
  Filled 2023-03-09: qty 10

## 2023-03-09 MED ORDER — OLANZAPINE 5 MG PO TABS: 5.0000 mg | ORAL_TABLET | Freq: Every day | ORAL | 3 refills | Status: DC

## 2023-03-09 MED ORDER — SODIUM CHLORIDE 0.9 % IV SOLN
150.0000 mg | Freq: Once | INTRAVENOUS | Status: AC
  Administered 2023-03-09: 150 mg via INTRAVENOUS
  Filled 2023-03-09: qty 5
  Filled 2023-03-09: qty 150

## 2023-03-09 MED ORDER — SODIUM CHLORIDE 0.9 % IV SOLN
10.0000 mg | Freq: Once | INTRAVENOUS | Status: AC
  Administered 2023-03-09: 10 mg via INTRAVENOUS
  Filled 2023-03-09: qty 10
  Filled 2023-03-09: qty 1

## 2023-03-09 MED ORDER — SODIUM CHLORIDE 0.9 % IV SOLN
545.0000 mg | Freq: Once | INTRAVENOUS | Status: AC
  Administered 2023-03-09: 550 mg via INTRAVENOUS
  Filled 2023-03-09: qty 55

## 2023-03-09 MED ORDER — HEPARIN SOD (PORK) LOCK FLUSH 100 UNIT/ML IV SOLN
500.0000 [IU] | Freq: Once | INTRAVENOUS | Status: AC | PRN
  Administered 2023-03-09: 500 [IU]
  Filled 2023-03-09: qty 5

## 2023-03-09 MED FILL — Dexamethasone Sodium Phosphate Inj 100 MG/10ML: INTRAMUSCULAR | Qty: 1 | Status: AC

## 2023-03-09 NOTE — Patient Instructions (Signed)
[facility]  Discharge Instructions: Thank you for choosing [facility] to provide your oncology and hematology care.  If you have a lab appointment with the Cancer Center, please go directly to the Cancer Center and check in at the registration area.  Wear comfortable clothing and clothing appropriate for easy access to any Portacath or PICC line.   We strive to give you quality time with your provider. You may need to reschedule your appointment if you arrive late (15 or more minutes).  Arriving late affects you and other patients whose appointments are after yours.  Also, if you miss three or more appointments without notifying the office, you may be dismissed from the clinic at the provider's discretion.      For prescription refill requests, have your pharmacy contact our office and allow 72 hours for refills to be completed.    Today you received the following chemotherapy and/or immunotherapy agents Carboplatin, Etoposide and Tecentriq.      To help prevent nausea and vomiting after your treatment, we encourage you to take your nausea medication as directed.  BELOW ARE SYMPTOMS THAT SHOULD BE REPORTED IMMEDIATELY: *FEVER GREATER THAN 100.4 F (38 C) OR HIGHER *CHILLS OR SWEATING *NAUSEA AND VOMITING THAT IS NOT CONTROLLED WITH YOUR NAUSEA MEDICATION *UNUSUAL SHORTNESS OF BREATH *UNUSUAL BRUISING OR BLEEDING *URINARY PROBLEMS (pain or burning when urinating, or frequent urination) *BOWEL PROBLEMS (unusual diarrhea, constipation, pain near the anus) TENDERNESS IN MOUTH AND THROAT WITH OR WITHOUT PRESENCE OF ULCERS (sore throat, sores in mouth, or a toothache) UNUSUAL RASH, SWELLING OR PAIN  UNUSUAL VAGINAL DISCHARGE OR ITCHING   Items with * indicate a potential emergency and should be followed up as soon as possible or go to the Emergency Department if any problems should occur.  Please show the CHEMOTHERAPY ALERT CARD or  IMMUNOTHERAPY ALERT CARD at check-in to the Emergency Department and triage nurse.  Should you have questions after your visit or need to cancel or reschedule your appointment, please contact [facility]  [phone] and follow the prompts.  Office hours are 8:00 a.m. to 4:30 p.m. Monday - Friday. Please note that voicemails left after 4:00 p.m. may not be returned until the following business day.  We are closed weekends and major holidays. You have access to a nurse at all times for urgent questions. Please call the main number to the clinic [phone] and follow the prompts.  For any non-urgent questions, you may also contact your provider using MyChart. We now offer e-Visits for anyone 30 and older to request care online for non-urgent symptoms. For details visit [email or web address].   Also download the MyChart app! Go to the app store, search "MyChart", open the app, select [facility], and log in with your MyChart username and password.
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
## 2023-03-09 NOTE — Progress Notes (Signed)
C/o fatigue and not feeling like eating.

## 2023-03-09 NOTE — Assessment & Plan Note (Addendum)
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 PET 2nd FEB 2024-  4.9 cm mass in the left perihilar upper lobe obstructing the apical  left upper lobe bronchi with direct mediastinal invasion and left hilar  and aortopulmonary lymphadenopathy; Bilateral adrenal nodules and masses, multiple hypoattenuating lesions  in the liver and right renal nodule concerning for metastatic disease. Pleural nodules in the posterior left pleural space measuring up to 1.5  cm compatible with pleural metastases.  Patient currently on [place] etoposide-Tecentriq every 3 weeks; recommend maintenance Tecentriq post chemotherapy.  # Status post 3 cycles of chemoimmunotherapy- MAY 15th, 2024- Moderate to marked response to therapy of central left upper lobe primary bronchogenic carcinoma, pleural, nodal and hepatic metastasis. Interval sclerosis of previously CT occult osseous metastasis, presumably secondary to interval healing. New pathologic fracture involving the posterior right fifth rib.  New left apical lung mass and adjacent nodule. Given response to therapy elsewhere, and presence of subtending bronchial narrowing/obstruction, favor postobstructive infectious etiology. Mixed response to therapy with isolated progression of pulmonary metastasis felt less likely.  # proceed with cycle #4. of carbo etoposide-Tecentriq every 3 weeks of planned 4 cycles. Labs-CBC/chemistries were reviewed with the patient.  # Thyroid abnormalities- [TSH- 0.015]- monitor for now; repeat Thyroid profile   # left upper lobe new infiltrate- ? Post obstructive pneumonia- s/p levaquin 500 mg/da x7 days; and prednisone taper- stable.   # JAN 2024-MRI brain with and without contrast- 4 mm right parafalcine enhancing lesion favored to reflect a small meningioma; however, given the history and absence of prior studies for comparison, a dural-based metastatic lesion can not be entirely excluded. 01/30/2023- MRI  brain-resolved. Stable.   # Pain- Right posterior fifth rib has an SUV max of 3.20. Right ninth posterior rib has an SUV max of 2.49. Right iliac bone lesion has an SUV max of 4; left femur lesion.  Discuss/ordered Zometa. vitamin D levels- pending.  If not better- recommend evaluation with radiation.  Percocet 7.5/325 1 to 2 pills every 12 hours. stable.  # COPD/cough- continue with inhalers.  Continue Tessalon Perles/ Tussionex prn. S/p  pulmonary Dr.Dgyali re: COPD- stable.# Smoking: Active smoker; recommend quitting/cutting down.  # Hoarseness of voice/ choking spells- s/p  ENT evaluation.s/p speech path evaluation- stable.  # Weight loss: Poor appetite-recommend zyprexa 5 mg qhs.    ? zometa # DISPOSITION: # Proceed with  chemo today carbo-Etop-tecentriq/ this week # next Monday- IVFs over 1 hour # follow up  in 3  weeks- MD; labs- cbc/cmp; LDH; Thyroid profile- -tecentriq - Dr.B

## 2023-03-10 ENCOUNTER — Telehealth: Payer: Self-pay | Admitting: *Deleted

## 2023-03-10 ENCOUNTER — Telehealth: Payer: Self-pay | Admitting: Internal Medicine

## 2023-03-10 ENCOUNTER — Inpatient Hospital Stay: Payer: Medicare PPO

## 2023-03-10 VITALS — BP 120/68 | HR 102 | Temp 99.1°F

## 2023-03-10 DIAGNOSIS — Z5111 Encounter for antineoplastic chemotherapy: Secondary | ICD-10-CM | POA: Diagnosis not present

## 2023-03-10 DIAGNOSIS — C3412 Malignant neoplasm of upper lobe, left bronchus or lung: Secondary | ICD-10-CM

## 2023-03-10 MED ORDER — SODIUM CHLORIDE 0.9 % IV SOLN
Freq: Once | INTRAVENOUS | Status: AC
  Filled 2023-03-10: qty 250

## 2023-03-10 MED ORDER — HEPARIN SOD (PORK) LOCK FLUSH 100 UNIT/ML IV SOLN
500.0000 [IU] | Freq: Once | INTRAVENOUS | Status: AC
  Administered 2023-03-10: 500 [IU] via INTRAVENOUS
  Filled 2023-03-10: qty 5

## 2023-03-10 MED ORDER — ATENOLOL 50 MG PO TABS: 50.0000 mg | ORAL_TABLET | Freq: Every day | ORAL | 2 refills | Status: DC

## 2023-03-10 MED ORDER — SODIUM CHLORIDE 0.9 % IV SOLN
100.0000 mg/m2 | Freq: Once | INTRAVENOUS | Status: AC
  Administered 2023-03-10: 200 mg via INTRAVENOUS
  Filled 2023-03-10: qty 10

## 2023-03-10 MED ORDER — SODIUM CHLORIDE 0.9 % IV SOLN
10.0000 mg | Freq: Once | INTRAVENOUS | Status: AC
  Administered 2023-03-10: 10 mg via INTRAVENOUS
  Filled 2023-03-10: qty 1
  Filled 2023-03-10: qty 10

## 2023-03-10 MED FILL — Dexamethasone Sodium Phosphate Inj 100 MG/10ML: INTRAMUSCULAR | Qty: 1 | Status: AC

## 2023-03-10 NOTE — Telephone Encounter (Signed)
Message left to pickup atenolol from pharmacy for tremor; this drug may lower blood pressure so please HOLD lisinopril until further notice.

## 2023-03-10 NOTE — Patient Instructions (Signed)
[facility]  Discharge Instructions: Thank you for choosing [facility] to provide your oncology and hematology care.  If you have a lab appointment with the Cancer Center, please go directly to the Cancer Center and check in at the registration area.  Wear comfortable clothing and clothing appropriate for easy access to any Portacath or PICC line.   We strive to give you quality time with your provider. You may need to reschedule your appointment if you arrive late (15 or more minutes).  Arriving late affects you and other patients whose appointments are after yours.  Also, if you miss three or more appointments without notifying the office, you may be dismissed from the clinic at the provider's discretion.      For prescription refill requests, have your pharmacy contact our office and allow 72 hours for refills to be completed.    Today you received the following chemotherapy and/or immunotherapy agents Etoposide      To help prevent nausea and vomiting after your treatment, we encourage you to take your nausea medication as directed.  BELOW ARE SYMPTOMS THAT SHOULD BE REPORTED IMMEDIATELY: *FEVER GREATER THAN 100.4 F (38 C) OR HIGHER *CHILLS OR SWEATING *NAUSEA AND VOMITING THAT IS NOT CONTROLLED WITH YOUR NAUSEA MEDICATION *UNUSUAL SHORTNESS OF BREATH *UNUSUAL BRUISING OR BLEEDING *URINARY PROBLEMS (pain or burning when urinating, or frequent urination) *BOWEL PROBLEMS (unusual diarrhea, constipation, pain near the anus) TENDERNESS IN MOUTH AND THROAT WITH OR WITHOUT PRESENCE OF ULCERS (sore throat, sores in mouth, or a toothache) UNUSUAL RASH, SWELLING OR PAIN  UNUSUAL VAGINAL DISCHARGE OR ITCHING   Items with * indicate a potential emergency and should be followed up as soon as possible or go to the Emergency Department if any problems should occur.  Please show the CHEMOTHERAPY ALERT CARD or IMMUNOTHERAPY ALERT CARD at check-in to  the Emergency Department and triage nurse.  Should you have questions after your visit or need to cancel or reschedule your appointment, please contact [facility]  [phone] and follow the prompts.  Office hours are 8:00 a.m. to 4:30 p.m. Monday - Friday. Please note that voicemails left after 4:00 p.m. may not be returned until the following business day.  We are closed weekends and major holidays. You have access to a nurse at all times for urgent questions. Please call the main number to the clinic [phone] and follow the prompts.  For any non-urgent questions, you may also contact your provider using MyChart. We now offer e-Visits for anyone 18 and older to request care online for non-urgent symptoms. For details visit [email or web address].   Also download the MyChart app! Go to the app store, search "MyChart", open the app, select , and log in with your MyChart username and password.    

## 2023-03-10 NOTE — Telephone Encounter (Signed)
Inform patient that I have called in the prescription of Atenolol for his tremors-likely from his thyroid abnormalities.  This pill can also-decrease the blood pressure.  So I would recommend holding off lisinopril at this time.  Andrea/Michelle- please inform pt/wife. Thanks GB

## 2023-03-11 ENCOUNTER — Inpatient Hospital Stay: Payer: Medicare PPO

## 2023-03-11 VITALS — BP 125/78 | HR 75 | Temp 97.3°F | Resp 16

## 2023-03-11 DIAGNOSIS — C3412 Malignant neoplasm of upper lobe, left bronchus or lung: Secondary | ICD-10-CM

## 2023-03-11 DIAGNOSIS — Z5111 Encounter for antineoplastic chemotherapy: Secondary | ICD-10-CM | POA: Diagnosis not present

## 2023-03-11 MED ORDER — HEPARIN SOD (PORK) LOCK FLUSH 100 UNIT/ML IV SOLN
500.0000 [IU] | Freq: Once | INTRAVENOUS | Status: AC | PRN
  Administered 2023-03-11: 500 [IU]
  Filled 2023-03-11: qty 5

## 2023-03-11 MED ORDER — SODIUM CHLORIDE 0.9 % IV SOLN
100.0000 mg/m2 | Freq: Once | INTRAVENOUS | Status: AC
  Administered 2023-03-11: 200 mg via INTRAVENOUS
  Filled 2023-03-11: qty 10

## 2023-03-11 MED ORDER — SODIUM CHLORIDE 0.9 % IV SOLN
Freq: Once | INTRAVENOUS | Status: AC
  Filled 2023-03-11: qty 250

## 2023-03-11 MED ORDER — SODIUM CHLORIDE 0.9 % IV SOLN
10.0000 mg | Freq: Once | INTRAVENOUS | Status: AC
  Administered 2023-03-11: 10 mg via INTRAVENOUS
  Filled 2023-03-11: qty 10

## 2023-03-13 ENCOUNTER — Inpatient Hospital Stay: Payer: Medicare PPO

## 2023-03-13 ENCOUNTER — Other Ambulatory Visit: Payer: Medicare PPO

## 2023-03-13 ENCOUNTER — Other Ambulatory Visit: Payer: Self-pay | Admitting: *Deleted

## 2023-03-13 ENCOUNTER — Ambulatory Visit: Payer: Medicare PPO

## 2023-03-13 ENCOUNTER — Encounter: Payer: Self-pay | Admitting: Medical Oncology

## 2023-03-13 ENCOUNTER — Encounter: Payer: Medicare PPO | Admitting: Medical Oncology

## 2023-03-13 ENCOUNTER — Inpatient Hospital Stay (HOSPITAL_BASED_OUTPATIENT_CLINIC_OR_DEPARTMENT_OTHER): Payer: Medicare PPO | Admitting: Medical Oncology

## 2023-03-13 ENCOUNTER — Other Ambulatory Visit: Payer: Self-pay

## 2023-03-13 VITALS — BP 104/65 | HR 62 | Temp 97.9°F | Resp 16

## 2023-03-13 DIAGNOSIS — C3412 Malignant neoplasm of upper lobe, left bronchus or lung: Secondary | ICD-10-CM

## 2023-03-13 DIAGNOSIS — Z09 Encounter for follow-up examination after completed treatment for conditions other than malignant neoplasm: Secondary | ICD-10-CM

## 2023-03-13 DIAGNOSIS — R630 Anorexia: Secondary | ICD-10-CM | POA: Diagnosis not present

## 2023-03-13 DIAGNOSIS — Z5111 Encounter for antineoplastic chemotherapy: Secondary | ICD-10-CM | POA: Diagnosis not present

## 2023-03-13 LAB — CMP (CANCER CENTER ONLY)
ALT: 24 U/L (ref 0–44)
AST: 23 U/L (ref 15–41)
Albumin: 3.5 g/dL (ref 3.5–5.0)
Alkaline Phosphatase: 94 U/L (ref 38–126)
Anion gap: 11 (ref 5–15)
BUN: 27 mg/dL — ABNORMAL HIGH (ref 8–23)
CO2: 22 mmol/L (ref 22–32)
Calcium: 9.1 mg/dL (ref 8.9–10.3)
Chloride: 104 mmol/L (ref 98–111)
Creatinine: 0.86 mg/dL (ref 0.61–1.24)
GFR, Estimated: >60 mL/min (ref >60)
Glucose, Bld: 135 mg/dL — ABNORMAL HIGH (ref 70–99)
Potassium: 3.6 mmol/L (ref 3.5–5.1)
Sodium: 137 mmol/L (ref 135–145)
Total Bilirubin: 1.1 mg/dL (ref 0.3–1.2)
Total Protein: 6.8 g/dL (ref 6.5–8.1)

## 2023-03-13 LAB — CBC WITH DIFFERENTIAL (CANCER CENTER ONLY)
Abs Immature Granulocytes: 0.04 10*3/uL (ref 0.00–0.07)
Basophils Absolute: 0 10*3/uL (ref 0.0–0.1)
Basophils Relative: 0 %
Eosinophils Absolute: 0.2 10*3/uL (ref 0.0–0.5)
Eosinophils Relative: 2 %
HCT: 34.4 % — ABNORMAL LOW (ref 39.0–52.0)
Hemoglobin: 11.7 g/dL — ABNORMAL LOW (ref 13.0–17.0)
Immature Granulocytes: 0 %
Lymphocytes Relative: 27 %
Lymphs Abs: 2.7 10*3/uL (ref 0.7–4.0)
MCH: 33.5 pg (ref 26.0–34.0)
MCHC: 34 g/dL (ref 30.0–36.0)
MCV: 98.6 fL (ref 80.0–100.0)
Monocytes Absolute: 0.1 10*3/uL (ref 0.1–1.0)
Monocytes Relative: 1 %
Neutro Abs: 6.9 10*3/uL (ref 1.7–7.7)
Neutrophils Relative %: 70 %
Platelet Count: 211 10*3/uL (ref 150–400)
RBC: 3.49 MIL/uL — ABNORMAL LOW (ref 4.22–5.81)
RDW: 16.2 % — ABNORMAL HIGH (ref 11.5–15.5)
WBC Count: 9.9 10*3/uL (ref 4.0–10.5)
nRBC: 0 % (ref 0.0–0.2)

## 2023-03-13 MED ORDER — HEPARIN SOD (PORK) LOCK FLUSH 100 UNIT/ML IV SOLN
500.0000 [IU] | Freq: Once | INTRAVENOUS | Status: AC | PRN
  Administered 2023-03-13: 500 [IU]
  Filled 2023-03-13: qty 5

## 2023-03-13 MED ORDER — PEGFILGRASTIM-CBQV 6 MG/0.6ML ~~LOC~~ SOSY
6.0000 mg | PREFILLED_SYRINGE | Freq: Once | SUBCUTANEOUS | Status: AC
  Administered 2023-03-13: 6 mg via SUBCUTANEOUS
  Filled 2023-03-13: qty 0.6

## 2023-03-13 MED ORDER — SODIUM CHLORIDE 0.9% FLUSH
10.0000 mL | Freq: Once | INTRAVENOUS | Status: AC | PRN
  Administered 2023-03-13: 10 mL
  Filled 2023-03-13: qty 10

## 2023-03-13 MED ORDER — SODIUM CHLORIDE 0.9 % IV SOLN
Freq: Once | INTRAVENOUS | Status: AC
  Filled 2023-03-13: qty 250
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
## 2023-03-16 ENCOUNTER — Inpatient Hospital Stay: Payer: Medicare PPO

## 2023-03-16 VITALS — BP 101/66 | HR 67 | Temp 97.8°F | Resp 18

## 2023-03-16 DIAGNOSIS — Z5111 Encounter for antineoplastic chemotherapy: Secondary | ICD-10-CM | POA: Diagnosis not present

## 2023-03-16 DIAGNOSIS — C3412 Malignant neoplasm of upper lobe, left bronchus or lung: Secondary | ICD-10-CM

## 2023-03-16 MED ORDER — SODIUM CHLORIDE 0.9 % IV SOLN
Freq: Once | INTRAVENOUS | Status: AC
  Filled 2023-03-16: qty 250

## 2023-03-16 MED ORDER — HEPARIN SOD (PORK) LOCK FLUSH 100 UNIT/ML IV SOLN
500.0000 [IU] | Freq: Once | INTRAVENOUS | Status: AC | PRN
  Administered 2023-03-16: 500 [IU]
  Filled 2023-03-16: qty 5

## 2023-03-16 MED ORDER — SODIUM CHLORIDE 0.9% FLUSH
10.0000 mL | Freq: Once | INTRAVENOUS | Status: AC | PRN
  Administered 2023-03-16: 10 mL
  Filled 2023-03-16: qty 10

## 2023-03-25 ENCOUNTER — Other Ambulatory Visit: Payer: Self-pay | Admitting: *Deleted

## 2023-03-25 MED ORDER — OXYCODONE HCL 15 MG PO TABS: 15.0000 mg | ORAL_TABLET | Freq: Three times a day (TID) | ORAL | 0 refills | Status: DC | PRN

## 2023-03-30 ENCOUNTER — Inpatient Hospital Stay (HOSPITAL_BASED_OUTPATIENT_CLINIC_OR_DEPARTMENT_OTHER): Payer: Medicare PPO | Admitting: Internal Medicine

## 2023-03-30 ENCOUNTER — Inpatient Hospital Stay: Payer: Medicare PPO

## 2023-03-30 ENCOUNTER — Inpatient Hospital Stay (HOSPITAL_BASED_OUTPATIENT_CLINIC_OR_DEPARTMENT_OTHER): Payer: Medicare PPO | Admitting: Hospice and Palliative Medicine

## 2023-03-30 ENCOUNTER — Encounter: Payer: Self-pay | Admitting: Internal Medicine

## 2023-03-30 ENCOUNTER — Ambulatory Visit: Payer: Medicare PPO

## 2023-03-30 VITALS — BP 97/69 | HR 76 | Temp 96.0°F | Ht 70.0 in | Wt 173.7 lb

## 2023-03-30 DIAGNOSIS — C3412 Malignant neoplasm of upper lobe, left bronchus or lung: Secondary | ICD-10-CM | POA: Diagnosis not present

## 2023-03-30 DIAGNOSIS — Z515 Encounter for palliative care: Secondary | ICD-10-CM | POA: Diagnosis not present

## 2023-03-30 DIAGNOSIS — Z5111 Encounter for antineoplastic chemotherapy: Secondary | ICD-10-CM | POA: Diagnosis not present

## 2023-03-30 LAB — CMP (CANCER CENTER ONLY)
ALT: 16 U/L (ref 0–44)
AST: 18 U/L (ref 15–41)
Albumin: 3.4 g/dL — ABNORMAL LOW (ref 3.5–5.0)
Alkaline Phosphatase: 100 U/L (ref 38–126)
Anion gap: 8 (ref 5–15)
BUN: 13 mg/dL (ref 8–23)
CO2: 23 mmol/L (ref 22–32)
Calcium: 8.7 mg/dL — ABNORMAL LOW (ref 8.9–10.3)
Chloride: 107 mmol/L (ref 98–111)
Creatinine: 0.87 mg/dL (ref 0.61–1.24)
GFR, Estimated: >60 mL/min (ref >60)
Glucose, Bld: 107 mg/dL — ABNORMAL HIGH (ref 70–99)
Potassium: 3.8 mmol/L (ref 3.5–5.1)
Sodium: 138 mmol/L (ref 135–145)
Total Bilirubin: 0.5 mg/dL (ref 0.3–1.2)
Total Protein: 6.5 g/dL (ref 6.5–8.1)

## 2023-03-30 LAB — CBC WITH DIFFERENTIAL (CANCER CENTER ONLY)
Abs Immature Granulocytes: 0.05 10*3/uL (ref 0.00–0.07)
Basophils Absolute: 0 10*3/uL (ref 0.0–0.1)
Basophils Relative: 0 %
Eosinophils Absolute: 0 10*3/uL (ref 0.0–0.5)
Eosinophils Relative: 0 %
HCT: 31.3 % — ABNORMAL LOW (ref 39.0–52.0)
Hemoglobin: 10.4 g/dL — ABNORMAL LOW (ref 13.0–17.0)
Immature Granulocytes: 1 %
Lymphocytes Relative: 31 %
Lymphs Abs: 2.7 10*3/uL (ref 0.7–4.0)
MCH: 33.9 pg (ref 26.0–34.0)
MCHC: 33.2 g/dL (ref 30.0–36.0)
MCV: 102 fL — ABNORMAL HIGH (ref 80.0–100.0)
Monocytes Absolute: 1.2 10*3/uL — ABNORMAL HIGH (ref 0.1–1.0)
Monocytes Relative: 14 %
Neutro Abs: 4.9 10*3/uL (ref 1.7–7.7)
Neutrophils Relative %: 54 %
Platelet Count: 241 10*3/uL (ref 150–400)
RBC: 3.07 MIL/uL — ABNORMAL LOW (ref 4.22–5.81)
RDW: 17.2 % — ABNORMAL HIGH (ref 11.5–15.5)
WBC Count: 8.9 10*3/uL (ref 4.0–10.5)
nRBC: 0 % (ref 0.0–0.2)

## 2023-03-30 LAB — LACTATE DEHYDROGENASE: LDH: 132 U/L (ref 98–192)

## 2023-03-30 MED ORDER — SODIUM CHLORIDE 0.9 % IV SOLN
Freq: Once | INTRAVENOUS | Status: AC
  Filled 2023-03-30: qty 250

## 2023-03-30 MED ORDER — SODIUM CHLORIDE 0.9 % IV SOLN
1200.0000 mg | Freq: Once | INTRAVENOUS | Status: AC
  Administered 2023-03-30: 1200 mg via INTRAVENOUS
  Filled 2023-03-30: qty 20

## 2023-03-30 MED ORDER — HEPARIN SOD (PORK) LOCK FLUSH 100 UNIT/ML IV SOLN
500.0000 [IU] | Freq: Once | INTRAVENOUS | Status: AC | PRN
  Administered 2023-03-30: 500 [IU]
  Filled 2023-03-30: qty 5

## 2023-03-30 MED ORDER — SODIUM CHLORIDE 0.9% FLUSH
10.0000 mL | Freq: Once | INTRAVENOUS | Status: AC
  Administered 2023-03-30: 10 mL via INTRAVENOUS
  Filled 2023-03-30: qty 10
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
## 2023-03-30 NOTE — Progress Notes (Signed)
No concerns today 

## 2023-03-30 NOTE — Patient Instructions (Signed)
[facility] [place] [facility]  Discharge Instructions: Thank you for choosing [facility] to provide your oncology and hematology care.  If you have a lab appointment with the Cancer Center, please go directly to the Cancer Center and check in at the registration area.  Wear comfortable clothing and clothing appropriate for easy access to any Portacath or PICC line.   We strive to give you quality time with your provider. You may need to reschedule your appointment if you arrive late (15 or more minutes).  Arriving late affects you and other patients whose appointments are after yours.  Also, if you miss three or more appointments without notifying the office, you may be dismissed from the clinic at the provider's discretion.      For prescription refill requests, have your pharmacy contact our office and allow 72 hours for refills to be completed.    Today you received the following chemotherapy and/or immunotherapy agents Atezolizumab.      To help prevent nausea and vomiting after your treatment, we encourage you to take your nausea medication as directed.  BELOW ARE SYMPTOMS THAT SHOULD BE REPORTED IMMEDIATELY: *FEVER GREATER THAN 100.4 F (38 C) OR HIGHER *CHILLS OR SWEATING *NAUSEA AND VOMITING THAT IS NOT CONTROLLED WITH YOUR NAUSEA MEDICATION *UNUSUAL SHORTNESS OF BREATH *UNUSUAL BRUISING OR BLEEDING *URINARY PROBLEMS (pain or burning when urinating, or frequent urination) *BOWEL PROBLEMS (unusual diarrhea, constipation, pain near the anus) TENDERNESS IN MOUTH AND THROAT WITH OR WITHOUT PRESENCE OF ULCERS (sore throat, sores in mouth, or a toothache) UNUSUAL RASH, SWELLING OR PAIN  UNUSUAL VAGINAL DISCHARGE OR ITCHING   Items with * indicate a potential emergency and should be followed up as soon as possible or go to the Emergency Department if any problems should occur.  Please show the CHEMOTHERAPY ALERT CARD or IMMUNOTHERAPY ALERT CARD at check-in  to the Emergency Department and triage nurse.  Should you have questions after your visit or need to cancel or reschedule your appointment, please contact [facility] [place] [facility]  [phone] and follow the prompts.  Office hours are 8:00 a.m. to 4:30 p.m. Monday - Friday. Please note that voicemails left after 4:00 p.m. may not be returned until the following business day.  We are closed weekends and major holidays. You have access to a nurse at all times for urgent questions. Please call the main number to the clinic [phone] and follow the prompts.  For any non-urgent questions, you may also contact your provider using MyChart. We now offer e-Visits for anyone 18 and older to request care online for non-urgent symptoms. For details visit [email or web address].   Also download the MyChart app! Go to the app store, search "MyChart", open the app, select [facility], and log in with your MyChart username and password.    
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
## 2023-03-30 NOTE — Assessment & Plan Note (Addendum)
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 PET 2nd FEB 2024-  4.9 cm mass in the left perihilar upper lobe obstructing the apical  left upper lobe bronchi with direct mediastinal invasion and left hilar  and aortopulmonary lymphadenopathy; Bilateral adrenal nodules and masses, multiple hypoattenuating lesions  in the liver and right renal nodule concerning for metastatic disease. Pleural nodules in the posterior left pleural space measuring up to 1.5  cm compatible with pleural metastases.  Patient currently on [place] etoposide-Tecentriq every 3 weeks; recommend maintenance Tecentriq post chemotherapy.  # Status post 3 cycles of chemoimmunotherapy- MAY 15th, 2024- Moderate to marked response to therapy of central left upper lobe primary bronchogenic carcinoma, pleural, nodal and hepatic metastasis. Interval sclerosis of previously CT occult osseous metastasis, presumably secondary to interval healing. New pathologic fracture involving the posterior right fifth rib.  New left apical lung mass and adjacent nodule. Given response to therapy elsewhere, and presence of subtending bronchial narrowing/obstruction, favor postobstructive infectious etiology. Mixed response to therapy with isolated progression of pulmonary metastasis felt less likely.  # proceed Tecentriq every 3 weeks- Labs-CBC/chemistries were reviewed with the patient; tolerating well except for thyroiditis see below.  # Thyroid abnormalities- [TSH- 0.015]- likely thyroiditis from Tecentriq-asymptomatic.  Monitor for now; repeat Thyroid profile.  Once patient gets hypothyroid-start the patient on Synthroid.   # left upper lobe new infiltrate- ? Post obstructive pneumonia- s/p levaquin 500 mg/da x7 days; and prednisone taper- stable.   # JAN 2024-MRI brain with and without contrast- 4 mm right parafalcine enhancing lesion favored to reflect a small meningioma; however, given the history and absence of  prior studies for comparison, a dural-based metastatic lesion can not be entirely excluded. 01/30/2023- MRI brain-resolved. stable. Will repeat MRI Brain in AUG  2024.   # Pain- Right posterior fifth rib has an SUV max of 3.20. Right ninth posterior rib has an SUV max of 2.49. Right iliac bone lesion has an SUV max of 4; left femur lesion.  Discuss/ordered Zometa. vitamin D levels- pending.  If not better- recommend evaluation with radiation.  Percocet 7.5/325 1 to 2 pills every 12 hours. stable.  # COPD/cough- continue with inhalers.  Continue Tessalon Perles/ Tussionex prn. S/p  pulmonary Dr.Dgyali re: COPD- stable.# Smoking: Active smoker; recommend quitting/cutting down. stable.  # Hoarseness of voice/ choking spells- s/p  ENT evaluation.s/p speech path evaluation- stable.  # Weight loss: Poor appetite- on zyprexa 5 mg at bedtime- stable.    ? zometa # DISPOSITION: # Proceed with  tecentriq- # next Monday- IVFs over 1 hour # follow up  in 3  weeks- MD; labs- cbc/cmp; LDH; Thyroid profile- -tecentriq - Dr.B

## 2023-03-31 LAB — THYROID PANEL WITH TSH
Free Thyroxine Index: 3.3 (ref 1.2–4.9)
T3 Uptake Ratio: 31 % (ref 24–39)
T4, Total: 10.7 ug/dL (ref 4.5–12.0)
TSH: <0.005 u[IU]/mL — ABNORMAL LOW (ref 0.450–4.500)

## 2023-04-03 ENCOUNTER — Telehealth: Payer: Medicare PPO | Admitting: Hospice and Palliative Medicine

## 2023-04-06 ENCOUNTER — Inpatient Hospital Stay: Payer: Medicare PPO | Attending: Internal Medicine

## 2023-04-06 VITALS — BP 112/72 | HR 60 | Temp 98.1°F | Resp 20

## 2023-04-06 DIAGNOSIS — Z79899 Other long term (current) drug therapy: Secondary | ICD-10-CM | POA: Diagnosis not present

## 2023-04-06 DIAGNOSIS — Z5112 Encounter for antineoplastic immunotherapy: Secondary | ICD-10-CM | POA: Diagnosis not present

## 2023-04-06 DIAGNOSIS — C7951 Secondary malignant neoplasm of bone: Secondary | ICD-10-CM | POA: Insufficient documentation

## 2023-04-06 DIAGNOSIS — Z9221 Personal history of antineoplastic chemotherapy: Secondary | ICD-10-CM | POA: Insufficient documentation

## 2023-04-06 DIAGNOSIS — C787 Secondary malignant neoplasm of liver and intrahepatic bile duct: Secondary | ICD-10-CM | POA: Diagnosis not present

## 2023-04-06 DIAGNOSIS — C3412 Malignant neoplasm of upper lobe, left bronchus or lung: Secondary | ICD-10-CM | POA: Insufficient documentation

## 2023-04-06 MED ORDER — SODIUM CHLORIDE 0.9 % IV SOLN
Freq: Once | INTRAVENOUS | Status: AC
  Filled 2023-04-06: qty 250

## 2023-04-06 MED ORDER — SODIUM CHLORIDE 0.9% FLUSH
10.0000 mL | Freq: Once | INTRAVENOUS | Status: AC | PRN
  Administered 2023-04-06: 10 mL
  Filled 2023-04-06: qty 10

## 2023-04-06 MED ORDER — HEPARIN SOD (PORK) LOCK FLUSH 100 UNIT/ML IV SOLN
500.0000 [IU] | Freq: Once | INTRAVENOUS | Status: AC | PRN
  Administered 2023-04-06: 500 [IU]
  Filled 2023-04-06: qty 5

## 2023-04-20 ENCOUNTER — Inpatient Hospital Stay: Payer: Medicare PPO | Admitting: Internal Medicine

## 2023-04-20 ENCOUNTER — Ambulatory Visit: Payer: Medicare PPO

## 2023-04-20 ENCOUNTER — Inpatient Hospital Stay: Payer: Medicare PPO

## 2023-04-20 ENCOUNTER — Encounter: Payer: Self-pay | Admitting: Internal Medicine

## 2023-04-20 ENCOUNTER — Ambulatory Visit: Payer: Medicare PPO | Admitting: Internal Medicine

## 2023-04-20 ENCOUNTER — Other Ambulatory Visit: Payer: Medicare PPO

## 2023-04-20 VITALS — BP 123/78 | HR 51

## 2023-04-20 VITALS — BP 123/80 | HR 58 | Temp 97.6°F | Ht 70.0 in | Wt 175.4 lb

## 2023-04-20 DIAGNOSIS — C3412 Malignant neoplasm of upper lobe, left bronchus or lung: Secondary | ICD-10-CM | POA: Diagnosis not present

## 2023-04-20 DIAGNOSIS — Z5112 Encounter for antineoplastic immunotherapy: Secondary | ICD-10-CM | POA: Diagnosis not present

## 2023-04-20 LAB — CMP (CANCER CENTER ONLY)
ALT: 13 U/L (ref 0–44)
AST: 24 U/L (ref 15–41)
Albumin: 3.5 g/dL (ref 3.5–5.0)
Alkaline Phosphatase: 107 U/L (ref 38–126)
Anion gap: 10 (ref 5–15)
BUN: 13 mg/dL (ref 8–23)
CO2: 22 mmol/L (ref 22–32)
Calcium: 8.7 mg/dL — ABNORMAL LOW (ref 8.9–10.3)
Chloride: 104 mmol/L (ref 98–111)
Creatinine: 1.11 mg/dL (ref 0.61–1.24)
GFR, Estimated: >60 mL/min (ref >60)
Glucose, Bld: 148 mg/dL — ABNORMAL HIGH (ref 70–99)
Potassium: 3.6 mmol/L (ref 3.5–5.1)
Sodium: 136 mmol/L (ref 135–145)
Total Bilirubin: 0.3 mg/dL (ref 0.3–1.2)
Total Protein: 6.8 g/dL (ref 6.5–8.1)

## 2023-04-20 LAB — CBC WITH DIFFERENTIAL (CANCER CENTER ONLY)
Abs Immature Granulocytes: 0.03 10*3/uL (ref 0.00–0.07)
Basophils Absolute: 0 10*3/uL (ref 0.0–0.1)
Basophils Relative: 0 %
Eosinophils Absolute: 0.8 10*3/uL — ABNORMAL HIGH (ref 0.0–0.5)
Eosinophils Relative: 9 %
HCT: 37.1 % — ABNORMAL LOW (ref 39.0–52.0)
Hemoglobin: 12.3 g/dL — ABNORMAL LOW (ref 13.0–17.0)
Immature Granulocytes: 0 %
Lymphocytes Relative: 31 %
Lymphs Abs: 2.9 10*3/uL (ref 0.7–4.0)
MCH: 33.9 pg (ref 26.0–34.0)
MCHC: 33.2 g/dL (ref 30.0–36.0)
MCV: 102.2 fL — ABNORMAL HIGH (ref 80.0–100.0)
Monocytes Absolute: 0.7 10*3/uL (ref 0.1–1.0)
Monocytes Relative: 8 %
Neutro Abs: 4.8 10*3/uL (ref 1.7–7.7)
Neutrophils Relative %: 52 %
Platelet Count: 242 10*3/uL (ref 150–400)
RBC: 3.63 MIL/uL — ABNORMAL LOW (ref 4.22–5.81)
RDW: 14.7 % (ref 11.5–15.5)
WBC Count: 9.3 10*3/uL (ref 4.0–10.5)
nRBC: 0 % (ref 0.0–0.2)

## 2023-04-20 LAB — LACTATE DEHYDROGENASE: LDH: 134 U/L (ref 98–192)

## 2023-04-20 LAB — TSH: TSH: 1.784 u[IU]/mL (ref 0.350–4.500)

## 2023-04-20 MED ORDER — SODIUM CHLORIDE 0.9 % IV SOLN
Freq: Once | INTRAVENOUS | Status: AC
  Filled 2023-04-20: qty 250

## 2023-04-20 MED ORDER — HEPARIN SOD (PORK) LOCK FLUSH 100 UNIT/ML IV SOLN
INTRAVENOUS | Status: AC
  Filled 2023-04-20: qty 5

## 2023-04-20 MED ORDER — SODIUM CHLORIDE 0.9 % IV SOLN
1200.0000 mg | Freq: Once | INTRAVENOUS | Status: AC
  Administered 2023-04-20: 1200 mg via INTRAVENOUS
  Filled 2023-04-20: qty 20

## 2023-04-20 MED ORDER — HEPARIN SOD (PORK) LOCK FLUSH 100 UNIT/ML IV SOLN
500.0000 [IU] | Freq: Once | INTRAVENOUS | Status: AC | PRN
  Administered 2023-04-20: 500 [IU]
  Filled 2023-04-20: qty 5

## 2023-04-20 NOTE — Assessment & Plan Note (Addendum)
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 PET 2nd FEB 2024-  4.9 cm mass in the left perihilar upper lobe obstructing the apical  left upper lobe bronchi with direct mediastinal invasion and left hilar  and aortopulmonary lymphadenopathy; Bilateral adrenal nodules and masses, multiple hypoattenuating lesions  in the liver and right renal nodule concerning for metastatic disease. Pleural nodules in the posterior left pleural space measuring up to 1.5  cm compatible with pleural metastases.  Patient currently on [place] etoposide-Tecentriq every 3 weeks; recommend maintenance Tecentriq post chemotherapy.  # Status post 3 cycles of chemoimmunotherapy- MAY 15th, 2024- Moderate to marked response to therapy of central left upper lobe primary bronchogenic carcinoma, pleural, nodal and hepatic metastasis. Interval sclerosis of previously CT occult osseous metastasis, presumably secondary to interval healing. New pathologic fracture involving the posterior right fifth rib.  New left apical lung mass and adjacent nodule. Given response to therapy elsewhere, and presence of subtending bronchial narrowing/obstruction, favor postobstructive infectious etiology. Mixed response to therapy with isolated progression of pulmonary metastasis felt less likely.  # proceed Tecentriq every 3 weeks- Labs-CBC/chemistries were reviewed with the patient; tolerating well except for thyroiditis see below. Will repeat scan in early sep 2024. Will order at next visit.   # Thyroid abnormalities- [TSH- 0.015]- likely thyroiditis from Tecentriq-asymptomatic- on atenolol.  Monitor for now; repeat Thyroid profile.  Watch out for hypothyroid state.    # left upper lobe new infiltrate- ? Post obstructive pneumonia- s/p levaquin 500 mg/da x7 days; and prednisone taper- stable.    # JAN 2024-MRI brain with and without contrast- 4 mm right parafalcine enhancing lesion favored to reflect a small  meningioma; however, given the history and absence of prior studies for comparison, a dural-based metastatic lesion can not be entirely excluded. 01/30/2023- MRI brain-resolved. stable. Will repeat MRI Brain in AUG  2024.   # Pain- Right posterior fifth rib has an SUV max of 3.20. Right ninth posterior rib has an SUV max of 2.49. Right iliac bone lesion has an SUV max of 4; left femur lesion.  Discuss/ordered Zometa. vitamin D levels- pending.  If not better- recommend evaluation with radiation.  Percocet 7.5/325 1 to 2 pills every 12 hours. stable.    # COPD/cough- continue with inhalers.  Continue Tessalon Perles/ Tussionex prn. S/p  pulmonary Dr.Dgyali re: COPD- stable.# Smoking: Active smoker; recommend quitting/cutting down.stable.    # Hoarseness of voice/ choking spells- s/p  ENT evaluation.s/p speech path evaluation- stable.    # Weight loss: Poor appetite- on zyprexa 5 mg at bedtime- stable.    ? zometa # DISPOSITION: # Proceed with  tecentriq- # follow up  in 3  weeks- MD; labs- cbc/cmp; LDH; Thyroid profile- -tecentriq - Dr.B

## 2023-04-20 NOTE — Patient Instructions (Signed)
 [facility]  Discharge Instructions: Thank you for choosing [facility] to provide your oncology and hematology care.  If you have a lab appointment with the [facility], please go directly to the [facility] and check in at the registration area.  Wear comfortable clothing and clothing appropriate for easy access to any Portacath or PICC line.   We strive to give you quality time with your provider. You may need to reschedule your appointment if you arrive late (15 or more minutes).  Arriving late affects you and other patients whose appointments are after yours.  Also, if you miss three or more appointments without notifying the office, you may be dismissed from the clinic at the provider's discretion.      For prescription refill requests, have your pharmacy contact our office and allow 72 hours for refills to be completed.    Today you received the following chemotherapy and/or immunotherapy agents- Tecentriq      To help prevent nausea and vomiting after your treatment, we encourage you to take your nausea medication as directed.  BELOW ARE SYMPTOMS THAT SHOULD BE REPORTED IMMEDIATELY: *FEVER GREATER THAN 100.4 F (38 C) OR HIGHER *CHILLS OR SWEATING *NAUSEA AND VOMITING THAT IS NOT CONTROLLED WITH YOUR NAUSEA MEDICATION *UNUSUAL SHORTNESS OF BREATH *UNUSUAL BRUISING OR BLEEDING *URINARY PROBLEMS (pain or burning when urinating, or frequent urination) *BOWEL PROBLEMS (unusual diarrhea, constipation, pain near the anus) TENDERNESS IN MOUTH AND THROAT WITH OR WITHOUT PRESENCE OF ULCERS (sore throat, sores in mouth, or a toothache) UNUSUAL RASH, SWELLING OR PAIN  UNUSUAL VAGINAL DISCHARGE OR ITCHING   Items with * indicate a potential emergency and should be followed up as soon as possible or go to the Emergency Department if any problems should occur.  Please show the CHEMOTHERAPY ALERT CARD or IMMUNOTHERAPY ALERT CARD at check-in to  the Emergency Department and triage nurse.  Should you have questions after your visit or need to cancel or reschedule your appointment, please contact [facility]  [phone] and follow the prompts.  Office hours are 8:00 a.m. to 4:30 p.m. Monday - Friday. Please note that voicemails left after 4:00 p.m. may not be returned until the following business day.  We are closed weekends and major holidays. You have access to a nurse at all times for urgent questions. Please call the main number to the clinic [phone] and follow the prompts.  For any non-urgent questions, you may also contact your provider using MyChart. We now offer e-Visits for anyone 70 and older to request care online for non-urgent symptoms. For details visit [email or web address].   Also download the MyChart app! Go to the app store, search "MyChart", open the app, select [facility], and log in with your MyChart username and password.
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
## 2023-04-20 NOTE — Progress Notes (Signed)
Sciatica down right hip and leg. Appetite is ok. Gained a pound or 2. Denies neuropathy. No constipation at present.Swallowing is better. Energy is better. Neuropathy is better.

## 2023-04-21 LAB — T4: T4, Total: 4.5 ug/dL (ref 4.5–12.0)

## 2023-05-01 ENCOUNTER — Other Ambulatory Visit: Payer: Self-pay | Admitting: *Deleted

## 2023-05-01 MED ORDER — OXYCODONE HCL 15 MG PO TABS: 15.0000 mg | ORAL_TABLET | Freq: Three times a day (TID) | ORAL | 0 refills | Status: DC | PRN

## 2023-05-06 ENCOUNTER — Encounter: Payer: Self-pay | Admitting: Internal Medicine

## 2023-05-11 ENCOUNTER — Inpatient Hospital Stay: Payer: Medicare PPO | Attending: Internal Medicine

## 2023-05-11 ENCOUNTER — Encounter: Payer: Self-pay | Admitting: Internal Medicine

## 2023-05-11 ENCOUNTER — Inpatient Hospital Stay (HOSPITAL_BASED_OUTPATIENT_CLINIC_OR_DEPARTMENT_OTHER): Payer: Medicare PPO | Admitting: Internal Medicine

## 2023-05-11 ENCOUNTER — Inpatient Hospital Stay: Payer: Medicare PPO

## 2023-05-11 VITALS — BP 121/84 | HR 59 | Temp 97.2°F | Ht 70.0 in | Wt 180.6 lb

## 2023-05-11 DIAGNOSIS — C7931 Secondary malignant neoplasm of brain: Secondary | ICD-10-CM

## 2023-05-11 DIAGNOSIS — C7951 Secondary malignant neoplasm of bone: Secondary | ICD-10-CM | POA: Diagnosis not present

## 2023-05-11 DIAGNOSIS — Z5112 Encounter for antineoplastic immunotherapy: Secondary | ICD-10-CM | POA: Insufficient documentation

## 2023-05-11 DIAGNOSIS — C787 Secondary malignant neoplasm of liver and intrahepatic bile duct: Secondary | ICD-10-CM | POA: Insufficient documentation

## 2023-05-11 DIAGNOSIS — Z79899 Other long term (current) drug therapy: Secondary | ICD-10-CM | POA: Diagnosis not present

## 2023-05-11 DIAGNOSIS — C3412 Malignant neoplasm of upper lobe, left bronchus or lung: Secondary | ICD-10-CM

## 2023-05-11 DIAGNOSIS — E039 Hypothyroidism, unspecified: Secondary | ICD-10-CM | POA: Insufficient documentation

## 2023-05-11 DIAGNOSIS — F17210 Nicotine dependence, cigarettes, uncomplicated: Secondary | ICD-10-CM | POA: Diagnosis not present

## 2023-05-11 LAB — CBC WITH DIFFERENTIAL (CANCER CENTER ONLY)
Abs Immature Granulocytes: 0.02 10*3/uL (ref 0.00–0.07)
Basophils Absolute: 0.1 10*3/uL (ref 0.0–0.1)
Basophils Relative: 1 %
Eosinophils Absolute: 0.7 10*3/uL — ABNORMAL HIGH (ref 0.0–0.5)
Eosinophils Relative: 8 %
HCT: 39.8 % (ref 39.0–52.0)
Hemoglobin: 13.1 g/dL (ref 13.0–17.0)
Immature Granulocytes: 0 %
Lymphocytes Relative: 42 %
Lymphs Abs: 3.6 10*3/uL (ref 0.7–4.0)
MCH: 33.8 pg (ref 26.0–34.0)
MCHC: 32.9 g/dL (ref 30.0–36.0)
MCV: 102.6 fL — ABNORMAL HIGH (ref 80.0–100.0)
Monocytes Absolute: 0.6 10*3/uL (ref 0.1–1.0)
Monocytes Relative: 7 %
Neutro Abs: 3.6 10*3/uL (ref 1.7–7.7)
Neutrophils Relative %: 42 %
Platelet Count: 178 10*3/uL (ref 150–400)
RBC: 3.88 MIL/uL — ABNORMAL LOW (ref 4.22–5.81)
RDW: 13.5 % (ref 11.5–15.5)
WBC Count: 8.5 10*3/uL (ref 4.0–10.5)
nRBC: 0 % (ref 0.0–0.2)

## 2023-05-11 LAB — LACTATE DEHYDROGENASE: LDH: 155 U/L (ref 98–192)

## 2023-05-11 LAB — CMP (CANCER CENTER ONLY)
ALT: 13 U/L (ref 0–44)
AST: 21 U/L (ref 15–41)
Albumin: 3.4 g/dL — ABNORMAL LOW (ref 3.5–5.0)
Alkaline Phosphatase: 93 U/L (ref 38–126)
Anion gap: 7 (ref 5–15)
BUN: 18 mg/dL (ref 8–23)
CO2: 24 mmol/L (ref 22–32)
Calcium: 8.8 mg/dL — ABNORMAL LOW (ref 8.9–10.3)
Chloride: 105 mmol/L (ref 98–111)
Creatinine: 1.12 mg/dL (ref 0.61–1.24)
GFR, Estimated: >60 mL/min (ref >60)
Glucose, Bld: 123 mg/dL — ABNORMAL HIGH (ref 70–99)
Potassium: 3.7 mmol/L (ref 3.5–5.1)
Sodium: 136 mmol/L (ref 135–145)
Total Bilirubin: 0.3 mg/dL (ref 0.3–1.2)
Total Protein: 6.6 g/dL (ref 6.5–8.1)

## 2023-05-11 MED ORDER — SODIUM CHLORIDE 0.9 % IV SOLN
Freq: Once | INTRAVENOUS | Status: AC
  Filled 2023-05-11: qty 250

## 2023-05-11 MED ORDER — SODIUM CHLORIDE 0.9 % IV SOLN
1200.0000 mg | Freq: Once | INTRAVENOUS | Status: AC
  Administered 2023-05-11: 1200 mg via INTRAVENOUS
  Filled 2023-05-11: qty 20

## 2023-05-11 MED ORDER — HEPARIN SOD (PORK) LOCK FLUSH 100 UNIT/ML IV SOLN
500.0000 [IU] | Freq: Once | INTRAVENOUS | Status: DC | PRN
  Filled 2023-05-11: qty 5

## 2023-05-11 MED ORDER — HEPARIN SOD (PORK) LOCK FLUSH 100 UNIT/ML IV SOLN
INTRAVENOUS | Status: AC
  Filled 2023-05-11: qty 5

## 2023-05-11 NOTE — Assessment & Plan Note (Addendum)
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 PET 2nd FEB 2024-  4.9 cm mass in the left perihilar upper lobe obstructing the apical  left upper lobe bronchi with direct mediastinal invasion and left hilar  and aortopulmonary lymphadenopathy; Bilateral adrenal nodules and masses, multiple hypoattenuating lesions  in the liver and right renal nodule concerning for metastatic disease. Pleural nodules in the posterior left pleural space measuring up to 1.5  cm compatible with pleural metastases.   # Status post 3 cycles of chemoimmunotherapy- MAY 15th, 2024- Moderate to marked response to therapy of central left upper lobe primary bronchogenic carcinoma, pleural, nodal and hepatic metastasis. Interval sclerosis of previously CT occult osseous metastasis, presumably secondary to interval healing. New pathologic fracture involving the posterior right fifth rib.  New left apical lung mass and adjacent nodule. Given response to therapy elsewhere, and presence of subtending bronchial narrowing/obstruction, favor postobstructive infectious etiology. Mixed response to therapy with isolated progression of pulmonary metastasis felt less likely. On maintained aTecentriq.   # proceed Tecentriq every 3 weeks- Labs-CBC/chemistries were reviewed with the patient; tolerating well except for thyroiditis see below. Will repeat scan in early sep 2024. Will order  today. Stable.   # Thyroid abnormalities- [TSH- 0.015]- likely thyroiditis from Tecentriq-asymptomatic- on atenolol but brady cardia- Recommend 25 mg once a day. Watch out for hypothyroid state. Stable.    # JAN 2024-MRI brain with and without contrast- 4 mm right parafalcine enhancing lesion favored to reflect a small meningioma; however, given the history and absence of prior studies for comparison, a dural-based metastatic lesion can not be entirely excluded. 01/30/2023- MRI brain-resolved. Stable.  Will repeat MRI Brain in  AUG  2024; ordered today.   # Pain- Right posterior fifth rib has an SUV max of 3.20. Right ninth posterior rib has an SUV max of 2.49. Right iliac bone lesion has an SUV max of 4; left femur lesion.  Discuss/ordered Zometa. vitamin D levels- pending.  If not better- recommend evaluation with radiation.  Percocet 7.5/325 1 to 2 pills every 12 hours. stable.    # COPD/cough- continue with inhalers.  Continue Tessalon Perles/ Tussionex prn. S/p  pulmonary Dr.Dgyali re: COPD- stable.# Smoking: Active smoker; recommend quitting/cutting down.stable.    # Hoarseness of voice/ choking spells- s/p  ENT evaluation.s/p speech path evaluation- stable.    # Weight loss: Poor appetite- on zyprexa 5 mg at bedtime- stable.    ? zometa # DISPOSITION: # Proceed with  tecentriq- # follow up  in 3  weeks- MD; labs- cbc/cmp; LDH; Thyroid profile- -tecentriq; CT CAP; Brain MRI- Dr.B

## 2023-05-11 NOTE — Patient Instructions (Signed)
 [facility]  Discharge Instructions: Thank you for choosing [facility] to provide your oncology and hematology care.  If you have a lab appointment with the [facility], please go directly to the [facility] and check in at the registration area.  Wear comfortable clothing and clothing appropriate for easy access to any Portacath or PICC line.   We strive to give you quality time with your provider. You may need to reschedule your appointment if you arrive late (15 or more minutes).  Arriving late affects you and other patients whose appointments are after yours.  Also, if you miss three or more appointments without notifying the office, you may be dismissed from the clinic at the provider's discretion.      For prescription refill requests, have your pharmacy contact our office and allow 72 hours for refills to be completed.    Today you received the following chemotherapy and/or immunotherapy agents- tecentriq      To help prevent nausea and vomiting after your treatment, we encourage you to take your nausea medication as directed.  BELOW ARE SYMPTOMS THAT SHOULD BE REPORTED IMMEDIATELY: *FEVER GREATER THAN 100.4 F (38 C) OR HIGHER *CHILLS OR SWEATING *NAUSEA AND VOMITING THAT IS NOT CONTROLLED WITH YOUR NAUSEA MEDICATION *UNUSUAL SHORTNESS OF BREATH *UNUSUAL BRUISING OR BLEEDING *URINARY PROBLEMS (pain or burning when urinating, or frequent urination) *BOWEL PROBLEMS (unusual diarrhea, constipation, pain near the anus) TENDERNESS IN MOUTH AND THROAT WITH OR WITHOUT PRESENCE OF ULCERS (sore throat, sores in mouth, or a toothache) UNUSUAL RASH, SWELLING OR PAIN  UNUSUAL VAGINAL DISCHARGE OR ITCHING   Items with * indicate a potential emergency and should be followed up as soon as possible or go to the Emergency Department if any problems should occur.  Please show the CHEMOTHERAPY ALERT CARD or IMMUNOTHERAPY ALERT CARD at check-in to  the Emergency Department and triage nurse.  Should you have questions after your visit or need to cancel or reschedule your appointment, please contact [facility]  [phone] and follow the prompts.  Office hours are 8:00 a.m. to 4:30 p.m. Monday - Friday. Please note that voicemails left after 4:00 p.m. may not be returned until the following business day.  We are closed weekends and major holidays. You have access to a nurse at all times for urgent questions. Please call the main number to the clinic [phone] and follow the prompts.  For any non-urgent questions, you may also contact your provider using MyChart. We now offer e-Visits for anyone 34 and older to request care online for non-urgent symptoms. For details visit [email or web address].   Also download the MyChart app! Go to the app store, search "MyChart", open the app, select [facility], and log in with your MyChart username and password.

## 2023-05-11 NOTE — Progress Notes (Signed)
Does he need to keep eating with plastic utensils?
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
## 2023-05-11 NOTE — Patient Instructions (Signed)
#   Take Atenolol  25 mg [1/2 a pill] a day-

## 2023-05-12 ENCOUNTER — Telehealth: Payer: Self-pay | Admitting: *Deleted

## 2023-05-12 MED ORDER — LEVOTHYROXINE SODIUM 100 MCG PO TABS: 100.0000 ug | ORAL_TABLET | Freq: Every day | ORAL | 1 refills | Status: DC

## 2023-05-12 NOTE — Telephone Encounter (Signed)
Discussed abnormal thyroid lab work. DR B is starting a new medication called levothyroxine 100 mcg; To take daily every morning on an empty stomach. Do not eat or drink anything for 1 hour after ingesting pill. Instructed to stop taking atenolol. Prescription has been sent to [facility]. Patient understands above.

## 2023-05-20 ENCOUNTER — Ambulatory Visit
Admission: RE | Admit: 2023-05-20 | Discharge: 2023-05-20 | Disposition: A | Discharge status: Home / Self Care | Payer: Medicare PPO | Source: Ambulatory Visit | Attending: Internal Medicine | Admitting: Internal Medicine

## 2023-05-20 DIAGNOSIS — C7931 Secondary malignant neoplasm of brain: Secondary | ICD-10-CM | POA: Insufficient documentation

## 2023-05-20 DIAGNOSIS — C3412 Malignant neoplasm of upper lobe, left bronchus or lung: Secondary | ICD-10-CM | POA: Diagnosis not present

## 2023-05-20 MED ORDER — IOHEXOL 300 MG/ML  SOLN
100.0000 mL | Freq: Once | INTRAMUSCULAR | Status: AC | PRN
  Administered 2023-05-20: 100 mL via INTRAVENOUS

## 2023-05-20 MED ORDER — GADOBUTROL 1 MMOL/ML IV SOLN
10.0000 mL | Freq: Once | INTRAVENOUS | Status: AC | PRN
  Administered 2023-05-20: 7.5 mL via INTRAVENOUS

## 2023-05-21 ENCOUNTER — Encounter: Payer: Self-pay | Admitting: Internal Medicine

## 2023-06-01 ENCOUNTER — Encounter: Payer: Self-pay | Admitting: Internal Medicine

## 2023-06-01 ENCOUNTER — Inpatient Hospital Stay: Payer: Medicare PPO

## 2023-06-01 ENCOUNTER — Other Ambulatory Visit: Payer: Self-pay | Admitting: *Deleted

## 2023-06-01 ENCOUNTER — Inpatient Hospital Stay: Payer: Medicare PPO | Admitting: Internal Medicine

## 2023-06-01 VITALS — BP 126/90 | HR 66 | Temp 97.8°F | Resp 17 | Ht 70.0 in | Wt 176.1 lb

## 2023-06-01 DIAGNOSIS — C7931 Secondary malignant neoplasm of brain: Secondary | ICD-10-CM

## 2023-06-01 DIAGNOSIS — C3412 Malignant neoplasm of upper lobe, left bronchus or lung: Secondary | ICD-10-CM

## 2023-06-01 DIAGNOSIS — Z5112 Encounter for antineoplastic immunotherapy: Secondary | ICD-10-CM | POA: Diagnosis not present

## 2023-06-01 LAB — CMP (CANCER CENTER ONLY)
ALT: 12 U/L (ref 0–44)
AST: 21 U/L (ref 15–41)
Albumin: 3.8 g/dL (ref 3.5–5.0)
Alkaline Phosphatase: 108 U/L (ref 38–126)
Anion gap: 8 (ref 5–15)
BUN: 15 mg/dL (ref 8–23)
CO2: 24 mmol/L (ref 22–32)
Calcium: 9.2 mg/dL (ref 8.9–10.3)
Chloride: 105 mmol/L (ref 98–111)
Creatinine: 1.21 mg/dL (ref 0.61–1.24)
GFR, Estimated: >60 mL/min (ref >60)
Glucose, Bld: 101 mg/dL — ABNORMAL HIGH (ref 70–99)
Potassium: 3.5 mmol/L (ref 3.5–5.1)
Sodium: 137 mmol/L (ref 135–145)
Total Bilirubin: 0.2 mg/dL — ABNORMAL LOW (ref 0.3–1.2)
Total Protein: 7.5 g/dL (ref 6.5–8.1)

## 2023-06-01 LAB — CBC WITH DIFFERENTIAL (CANCER CENTER ONLY)
Abs Immature Granulocytes: 0.02 10*3/uL (ref 0.00–0.07)
Basophils Absolute: 0 10*3/uL (ref 0.0–0.1)
Basophils Relative: 0 %
Eosinophils Absolute: 0.2 10*3/uL (ref 0.0–0.5)
Eosinophils Relative: 2 %
HCT: 41.1 % (ref 39.0–52.0)
Hemoglobin: 13.8 g/dL (ref 13.0–17.0)
Immature Granulocytes: 0 %
Lymphocytes Relative: 41 %
Lymphs Abs: 3.5 10*3/uL (ref 0.7–4.0)
MCH: 33.4 pg (ref 26.0–34.0)
MCHC: 33.6 g/dL (ref 30.0–36.0)
MCV: 99.5 fL (ref 80.0–100.0)
Monocytes Absolute: 0.6 10*3/uL (ref 0.1–1.0)
Monocytes Relative: 8 %
Neutro Abs: 4 10*3/uL (ref 1.7–7.7)
Neutrophils Relative %: 49 %
Platelet Count: 188 10*3/uL (ref 150–400)
RBC: 4.13 MIL/uL — ABNORMAL LOW (ref 4.22–5.81)
RDW: 13.4 % (ref 11.5–15.5)
WBC Count: 8.4 10*3/uL (ref 4.0–10.5)
nRBC: 0 % (ref 0.0–0.2)

## 2023-06-01 LAB — LACTATE DEHYDROGENASE: LDH: 199 U/L — ABNORMAL HIGH (ref 98–192)

## 2023-06-01 MED ORDER — SODIUM CHLORIDE 0.9 % IV SOLN
1200.0000 mg | Freq: Once | INTRAVENOUS | Status: AC
  Administered 2023-06-01: 1200 mg via INTRAVENOUS
  Filled 2023-06-01: qty 20

## 2023-06-01 MED ORDER — HEPARIN SOD (PORK) LOCK FLUSH 100 UNIT/ML IV SOLN
500.0000 [IU] | Freq: Once | INTRAVENOUS | Status: DC | PRN
  Filled 2023-06-01: qty 5

## 2023-06-01 MED ORDER — OXYCODONE HCL 15 MG PO TABS: 15.0000 mg | ORAL_TABLET | Freq: Three times a day (TID) | ORAL | 0 refills | Status: DC | PRN

## 2023-06-01 MED ORDER — SODIUM CHLORIDE 0.9 % IV SOLN
Freq: Once | INTRAVENOUS | Status: AC
  Filled 2023-06-01: qty 250

## 2023-06-01 NOTE — Patient Instructions (Signed)
[facility]  Discharge Instructions: Thank you for choosing [facility] to provide your oncology and hematology care.  If you have a lab appointment with the [facility], please go directly to the [facility] and check in at the registration area.  Wear comfortable clothing and clothing appropriate for easy access to any Portacath or PICC line.   We strive to give you quality time with your provider. You may need to reschedule your appointment if you arrive late (15 or more minutes).  Arriving late affects you and other patients whose appointments are after yours.  Also, if you miss three or more appointments without notifying the office, you may be dismissed from the clinic at the provider's discretion.      For prescription refill requests, have your pharmacy contact our office and allow 72 hours for refills to be completed.    Today you received the following chemotherapy and/or immunotherapy agents- Tecentriq      To help prevent nausea and vomiting after your treatment, we encourage you to take your nausea medication as directed.  BELOW ARE SYMPTOMS THAT SHOULD BE REPORTED IMMEDIATELY: *FEVER GREATER THAN 100.4 F (38 C) OR HIGHER *CHILLS OR SWEATING *NAUSEA AND VOMITING THAT IS NOT CONTROLLED WITH YOUR NAUSEA MEDICATION *UNUSUAL SHORTNESS OF BREATH *UNUSUAL BRUISING OR BLEEDING *URINARY PROBLEMS (pain or burning when urinating, or frequent urination) *BOWEL PROBLEMS (unusual diarrhea, constipation, pain near the anus) TENDERNESS IN MOUTH AND THROAT WITH OR WITHOUT PRESENCE OF ULCERS (sore throat, sores in mouth, or a toothache) UNUSUAL RASH, SWELLING OR PAIN  UNUSUAL VAGINAL DISCHARGE OR ITCHING   Items with * indicate a potential emergency and should be followed up as soon as possible or go to the Emergency Department if any problems should occur.  Please show the CHEMOTHERAPY ALERT CARD or IMMUNOTHERAPY ALERT CARD at check-in to  the Emergency Department and triage nurse.  Should you have questions after your visit or need to cancel or reschedule your appointment, please contact [facility]  [phone] and follow the prompts.  Office hours are 8:00 a.m. to 4:30 p.m. Monday - Friday. Please note that voicemails left after 4:00 p.m. may not be returned until the following business day.  We are closed weekends and major holidays. You have access to a nurse at all times for urgent questions. Please call the main number to the clinic [phone] and follow the prompts.  For any non-urgent questions, you may also contact your provider using MyChart. We now offer e-Visits for anyone 70 and older to request care online for non-urgent symptoms. For details visit [email or web address].   Also download the MyChart app! Go to the app store, search "MyChart", open the app, select [facility], and log in with your MyChart username and password.

## 2023-06-01 NOTE — Assessment & Plan Note (Addendum)
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 PET 2nd FEB 2024-  4.9 cm mass in the left perihilar upper lobe obstructing the apical  left upper lobe bronchi with direct mediastinal invasion and left hilar  and aortopulmonary lymphadenopathy; Bilateral adrenal nodules and masses, multiple hypoattenuating lesions  in the liver and right renal nodule concerning for metastatic disease. Pleural nodules in the posterior left pleural space measuring up to 1.5  cm compatible with pleural metastases.   # currently on Atezolizumab-AUG 16th, 2024- Decreased size of left apical lung lesion and a posterior satellite nodule. A lateral satellite nodule has minimally enlarged- ? second primary. The central left upper lobe/suprahilar primary is no longer identified; Minimal improvement in hepatic metastasis. Similar osseous metastasis with healing of previously described right 5th rib pathologic fracture.  # proceed Tecentriq every 3 weeks- Labs-CBC/chemistries were reviewed with the patient; tolerating well except for thyroiditis see below. Will repeat scan in early sep 2024. Will order  today. Stable.   # Hypothyroidism: sec to Tecentriq-asymptomatic-Continue synthroid 100 mcg once a day.   # JAN 2024-MRI brain with and without contrast- 4 mm right parafalcine enhancing lesion favored to reflect a small meningioma; however, given the history and absence of prior studies for comparison, a dural-based metastatic lesion can not be entirely excluded. 01/30/2023- MRI brain-resolved. MRI Brain in AUG  2024- results pending.   # Pain- Right posterior fifth rib has an SUV max of 3.20. Right ninth posterior rib has an SUV max of 2.49. Right iliac bone lesion has an SUV max of 4; left femur lesion.  Discuss/ordered Zometa. vitamin D levels- pending.  If not better- recommend evaluation with radiation.  Percocet 7.5/325 1 to 2 pills every 12 hours. stable.    # COPD/cough- continue with  inhalers.  Continue Tessalon Perles/ Tussionex prn. S/p  pulmonary Dr.Dgyali re: COPD- stable.# Smoking: Active smoker; recommend quitting/cutting down.stable.    # Hoarseness of voice/ choking spells- s/p  ENT evaluation.s/p speech path evaluation- stable.    # Weight loss: Poor appetite- on zyprexa 5 mg at bedtime- stable.    #Incidental findings on Imaging  CT, AUG 2024: Air within the urinary bladder? NO Foley [discussed with urology, Dr.Sninski- will order UA/culture]; emphysema; Coronary artery atherosclerosis. Aortic Atherosclerosis; Esophageal air fluid level suggests dysmotility or gastroesophageal reflux. I reviewed/discussed/counseled the patient.   ? Zometa; RN will call re: MRI.  # DISPOSITION: # Proceed with  tecentriq- # follow up  in 3  weeks- MD; labs- cbc/cmp; LDH; Thyroid profile- -tecentriq;  Dr.B  # I reviewed the blood work- with the patient in detail; also reviewed the imaging independently [as summarized above]; and with the patient in detail.
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
## 2023-06-01 NOTE — Progress Notes (Signed)
MRI 05/20/23.

## 2023-06-01 NOTE — Addendum Note (Signed)
Addended by: Idelia Salm on: 06/01/2023 03:12 PM   Modules accepted: Orders

## 2023-06-01 NOTE — Progress Notes (Signed)
Voice has come back. Appetite is fine.Swallowing well.  Denies pain. Sleeps good. Fatigued. Energy is the only thing really bothering him,

## 2023-06-02 LAB — THYROID PANEL WITH TSH
Free Thyroxine Index: 0.8 — ABNORMAL LOW (ref 1.2–4.9)
T3 Uptake Ratio: 20 % — ABNORMAL LOW (ref 24–39)
T4, Total: 4.2 ug/dL — ABNORMAL LOW (ref 4.5–12.0)
TSH: 61.8 u[IU]/mL — ABNORMAL HIGH (ref 0.450–4.500)

## 2023-06-03 ENCOUNTER — Encounter: Payer: Self-pay | Admitting: *Deleted

## 2023-06-03 DIAGNOSIS — C7931 Secondary malignant neoplasm of brain: Secondary | ICD-10-CM

## 2023-06-03 DIAGNOSIS — C3412 Malignant neoplasm of upper lobe, left bronchus or lung: Secondary | ICD-10-CM

## 2023-06-03 NOTE — Progress Notes (Signed)
Unable to reach the patient.  I called the patient left a voicemail to call us back to discuss the results of the brain MRI.  Brain MRI multiple lesions concerning for progression in the brain.  Recommend evaluation with Dr. Aggie Cosier.  Informed staff.

## 2023-06-04 ENCOUNTER — Inpatient Hospital Stay (HOSPITAL_BASED_OUTPATIENT_CLINIC_OR_DEPARTMENT_OTHER): Payer: Medicare PPO | Admitting: Hospice and Palliative Medicine

## 2023-06-04 ENCOUNTER — Encounter: Payer: Self-pay | Admitting: Internal Medicine

## 2023-06-04 ENCOUNTER — Encounter: Payer: Self-pay | Admitting: *Deleted

## 2023-06-04 DIAGNOSIS — Z515 Encounter for palliative care: Secondary | ICD-10-CM | POA: Diagnosis not present

## 2023-06-04 DIAGNOSIS — C3412 Malignant neoplasm of upper lobe, left bronchus or lung: Secondary | ICD-10-CM

## 2023-06-04 NOTE — Progress Notes (Signed)
Virtual Visit via Telephone Note  I connected with Martin Gomez. on 06/04/23 at  1:50 PM EDT by telephone and verified that I am speaking with the correct person using two identifiers.  Location: Patient: Home  Provider: Clinic   I discussed the limitations, risks, security and privacy concerns of performing an evaluation and management service by telephone and the availability of in person appointments. I also discussed with the patient that there may be a patient responsible charge related to this service. The patient expressed understanding and agreed to proceed.   History of Present Illness: Martin Vaal. is a 67 y.o. 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  Patient was referred to palliative care to address goals and manage ongoing symptoms.    Observations/Objective: I called and spoke with patient by phone.  He reports that he is doing well.  He denies any significant changes or concerns.  No symptomatic complaints today.  Patient reports that his pain is stable on current regimen.  Denies any adverse effects from pain medications.  Assessment and Plan: Stage IV small cell lung cancer -currently on Tecentriq.  Reportedly symptomatically stable.  Neoplasm related pain -stable.  Continue as needed oxycodone.  Follow Up Instructions: Follow-up telephone visit 1 to 2 months   I discussed the assessment and treatment plan with the patient. The patient was provided an opportunity to ask questions and all were answered. The patient agreed with the plan and demonstrated an understanding of the instructions.   The patient was advised to call back or seek an in-person evaluation if the symptoms worsen or if the condition fails to improve as anticipated.  I provided 5 minutes of non-face-to-face time during this encounter.   Malachy Moan, NP

## 2023-06-04 NOTE — Progress Notes (Signed)
Phone call made to patient and his wife to review brain MRI results per Dr. Leonard Schwartz. Pt and wife did not answer. Message left to call back to discuss results and next steps.

## 2023-06-04 NOTE — Progress Notes (Signed)
Per Dr. Leonard Schwartz, pt needs referral to Dr. Rushie Chestnut for multiple brain lesions on recent brain MRI. Referral placed and pt will be called with appt once scheduled.

## 2023-06-05 NOTE — Progress Notes (Signed)
Spoke with pt this morning and informed of brain MRI results per Dr. B and instructed that Dr. Senaida Lange recommendation is to consult with Dr. Rushie Chestnut regarding brain radiation. Reviewed upcoming appt with Dr. Rushie Chestnut scheduled for 9/5 at 930am and advised to keep other appts with Dr. B as scheduled. Instructed to call with any questions or needs. Pt verbalized understanding. Nothing further needed at this time.

## 2023-06-10 ENCOUNTER — Telehealth: Payer: Self-pay | Admitting: *Deleted

## 2023-06-10 NOTE — Telephone Encounter (Signed)
Spoke with pt's wife who stated that pt is not feeling well with fever of 101.5 that started yesterday with chills, vomiting, and diarrhea. Afebrile today but continues to have chills. Pt is drinking fluids/electrolytes to stay hydrated. Encouraged pt's wife for pt to have a covid test. Informed will reschedule his appt with Dr. Rushie Chestnut that was originally on 9/5 to mid next week. Pt's wife will back with results from covid test.

## 2023-06-10 NOTE — Telephone Encounter (Signed)
Covid test was negative. Appt with Dr. Rushie Chestnut rescheduled to Tues 06/16/23 at 9:30am. Pt's wife made aware and instructed to call back if symptoms worsen. Encouraged to continue drinking fluids.

## 2023-06-11 ENCOUNTER — Ambulatory Visit: Payer: Medicare PPO | Admitting: Radiation Oncology

## 2023-06-15 ENCOUNTER — Encounter: Payer: Self-pay | Admitting: Internal Medicine

## 2023-06-15 ENCOUNTER — Telehealth: Payer: Self-pay | Admitting: *Deleted

## 2023-06-15 ENCOUNTER — Other Ambulatory Visit: Payer: Self-pay | Admitting: *Deleted

## 2023-06-15 DIAGNOSIS — C3412 Malignant neoplasm of upper lobe, left bronchus or lung: Secondary | ICD-10-CM

## 2023-06-15 DIAGNOSIS — R197 Diarrhea, unspecified: Secondary | ICD-10-CM

## 2023-06-15 DIAGNOSIS — R509 Fever, unspecified: Secondary | ICD-10-CM

## 2023-06-15 DIAGNOSIS — R112 Nausea with vomiting, unspecified: Secondary | ICD-10-CM

## 2023-06-15 NOTE — Telephone Encounter (Signed)
Pt's wife called to request appt for pt to come in to be seen and possibly receive IV fluids. States that he has been afebrile since Friday but has been unable to take in solid foods over the weekend. Pt has been drinking fluids well per pt's wife but continues to feel weak. [facility] informed and appt given for tomorrow at 9:30am for port labs, see Josh, and receive IV fluids. Offered pt's wife appt for today but they declined and would rather come in tomorrow. Informed of new appt for Dr. Rushie Chestnut on Wed 9/18 at 11am per their request to reschedule for when pt is feeling better. Nothing further needed at this time.

## 2023-06-16 ENCOUNTER — Other Ambulatory Visit: Payer: Self-pay

## 2023-06-16 ENCOUNTER — Inpatient Hospital Stay (HOSPITAL_BASED_OUTPATIENT_CLINIC_OR_DEPARTMENT_OTHER): Payer: Medicare PPO | Admitting: Hospice and Palliative Medicine

## 2023-06-16 ENCOUNTER — Inpatient Hospital Stay: Payer: Medicare PPO | Attending: Internal Medicine

## 2023-06-16 ENCOUNTER — Inpatient Hospital Stay: Payer: Medicare PPO

## 2023-06-16 ENCOUNTER — Ambulatory Visit: Payer: Medicare PPO | Admitting: Radiation Oncology

## 2023-06-16 ENCOUNTER — Encounter: Payer: Self-pay | Admitting: Hospice and Palliative Medicine

## 2023-06-16 VITALS — BP 118/83 | HR 69 | Temp 97.1°F | Resp 18 | Ht 70.0 in | Wt 165.0 lb

## 2023-06-16 DIAGNOSIS — R509 Fever, unspecified: Secondary | ICD-10-CM

## 2023-06-16 DIAGNOSIS — C3412 Malignant neoplasm of upper lobe, left bronchus or lung: Secondary | ICD-10-CM

## 2023-06-16 DIAGNOSIS — E860 Dehydration: Secondary | ICD-10-CM | POA: Diagnosis not present

## 2023-06-16 DIAGNOSIS — F17210 Nicotine dependence, cigarettes, uncomplicated: Secondary | ICD-10-CM | POA: Insufficient documentation

## 2023-06-16 DIAGNOSIS — R197 Diarrhea, unspecified: Secondary | ICD-10-CM

## 2023-06-16 DIAGNOSIS — C7931 Secondary malignant neoplasm of brain: Secondary | ICD-10-CM | POA: Insufficient documentation

## 2023-06-16 DIAGNOSIS — E039 Hypothyroidism, unspecified: Secondary | ICD-10-CM | POA: Diagnosis not present

## 2023-06-16 DIAGNOSIS — Z9221 Personal history of antineoplastic chemotherapy: Secondary | ICD-10-CM | POA: Insufficient documentation

## 2023-06-16 DIAGNOSIS — Z5112 Encounter for antineoplastic immunotherapy: Secondary | ICD-10-CM | POA: Diagnosis present

## 2023-06-16 DIAGNOSIS — R112 Nausea with vomiting, unspecified: Secondary | ICD-10-CM

## 2023-06-16 DIAGNOSIS — C7951 Secondary malignant neoplasm of bone: Secondary | ICD-10-CM | POA: Diagnosis not present

## 2023-06-16 DIAGNOSIS — Z79899 Other long term (current) drug therapy: Secondary | ICD-10-CM | POA: Diagnosis not present

## 2023-06-16 DIAGNOSIS — E876 Hypokalemia: Secondary | ICD-10-CM

## 2023-06-16 LAB — CBC WITH DIFFERENTIAL (CANCER CENTER ONLY)
Abs Immature Granulocytes: 0.09 10*3/uL — ABNORMAL HIGH (ref 0.00–0.07)
Basophils Absolute: 0 10*3/uL (ref 0.0–0.1)
Basophils Relative: 0 %
Eosinophils Absolute: 0.1 10*3/uL (ref 0.0–0.5)
Eosinophils Relative: 1 %
HCT: 37.7 % — ABNORMAL LOW (ref 39.0–52.0)
Hemoglobin: 13.2 g/dL (ref 13.0–17.0)
Immature Granulocytes: 1 %
Lymphocytes Relative: 23 %
Lymphs Abs: 2.1 10*3/uL (ref 0.7–4.0)
MCH: 32.9 pg (ref 26.0–34.0)
MCHC: 35 g/dL (ref 30.0–36.0)
MCV: 94 fL (ref 80.0–100.0)
Monocytes Absolute: 0.7 10*3/uL (ref 0.1–1.0)
Monocytes Relative: 8 %
Neutro Abs: 6.3 10*3/uL (ref 1.7–7.7)
Neutrophils Relative %: 67 %
Platelet Count: 206 10*3/uL (ref 150–400)
RBC: 4.01 MIL/uL — ABNORMAL LOW (ref 4.22–5.81)
RDW: 14.1 % (ref 11.5–15.5)
Smear Review: ADEQUATE
WBC Count: 9.4 10*3/uL (ref 4.0–10.5)
nRBC: 0 % (ref 0.0–0.2)

## 2023-06-16 LAB — URINALYSIS, COMPLETE (UACMP) WITH MICROSCOPIC
Bilirubin Urine: NEGATIVE
Glucose, UA: NEGATIVE mg/dL
Ketones, ur: NEGATIVE mg/dL
Nitrite: NEGATIVE
Protein, ur: 30 mg/dL — AB
Specific Gravity, Urine: 1.011 (ref 1.005–1.030)
WBC, UA: >50 WBC/hpf (ref 0–5)
pH: 6 (ref 5.0–8.0)

## 2023-06-16 LAB — CMP (CANCER CENTER ONLY)
ALT: 113 U/L — ABNORMAL HIGH (ref 0–44)
AST: 91 U/L — ABNORMAL HIGH (ref 15–41)
Albumin: 3 g/dL — ABNORMAL LOW (ref 3.5–5.0)
Alkaline Phosphatase: 672 U/L — ABNORMAL HIGH (ref 38–126)
Anion gap: 10 (ref 5–15)
BUN: 39 mg/dL — ABNORMAL HIGH (ref 8–23)
CO2: 21 mmol/L — ABNORMAL LOW (ref 22–32)
Calcium: 8.6 mg/dL — ABNORMAL LOW (ref 8.9–10.3)
Chloride: 100 mmol/L (ref 98–111)
Creatinine: 1.81 mg/dL — ABNORMAL HIGH (ref 0.61–1.24)
GFR, Estimated: 41 mL/min — ABNORMAL LOW (ref >60)
Glucose, Bld: 125 mg/dL — ABNORMAL HIGH (ref 70–99)
Potassium: 3 mmol/L — ABNORMAL LOW (ref 3.5–5.1)
Sodium: 131 mmol/L — ABNORMAL LOW (ref 135–145)
Total Bilirubin: 1.6 mg/dL — ABNORMAL HIGH (ref 0.3–1.2)
Total Protein: 7.4 g/dL (ref 6.5–8.1)

## 2023-06-16 LAB — MAGNESIUM: Magnesium: 2.5 mg/dL — ABNORMAL HIGH (ref 1.7–2.4)

## 2023-06-16 MED ORDER — SODIUM CHLORIDE 0.9 % IV SOLN
INTRAVENOUS | Status: DC
  Filled 2023-06-16 (×2): qty 250

## 2023-06-16 MED ORDER — SODIUM CHLORIDE 0.9% FLUSH
10.0000 mL | Freq: Once | INTRAVENOUS | Status: AC
  Administered 2023-06-16: 10 mL via INTRAVENOUS
  Filled 2023-06-16: qty 10

## 2023-06-16 MED ORDER — CEPHALEXIN 500 MG PO CAPS: 500.0000 mg | ORAL_CAPSULE | Freq: Two times a day (BID) | ORAL | 0 refills | Status: DC

## 2023-06-16 MED ORDER — HEPARIN SOD (PORK) LOCK FLUSH 100 UNIT/ML IV SOLN
500.0000 [IU] | Freq: Once | INTRAVENOUS | Status: AC
  Administered 2023-06-16: 500 [IU] via INTRAVENOUS
  Filled 2023-06-16: qty 5

## 2023-06-16 MED ORDER — ONDANSETRON HCL 4 MG/2ML IJ SOLN
8.0000 mg | Freq: Once | INTRAMUSCULAR | Status: AC
  Administered 2023-06-16: 8 mg via INTRAVENOUS
  Filled 2023-06-16: qty 4

## 2023-06-16 MED ORDER — POTASSIUM CHLORIDE 20 MEQ/100ML IV SOLN
20.0000 meq | Freq: Once | INTRAVENOUS | Status: AC
  Administered 2023-06-16: 20 meq via INTRAVENOUS
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
## 2023-06-16 NOTE — Addendum Note (Signed)
Addended by: Laurette Schimke R on: 06/16/2023 11:26 AM   Modules accepted: Orders

## 2023-06-18 LAB — URINE CULTURE: Culture: >=100000 — AB

## 2023-06-19 ENCOUNTER — Inpatient Hospital Stay: Payer: Medicare PPO

## 2023-06-19 VITALS — BP 132/88 | HR 61 | Temp 96.2°F | Resp 20

## 2023-06-19 DIAGNOSIS — Z5112 Encounter for antineoplastic immunotherapy: Secondary | ICD-10-CM | POA: Diagnosis not present

## 2023-06-19 DIAGNOSIS — C3412 Malignant neoplasm of upper lobe, left bronchus or lung: Secondary | ICD-10-CM

## 2023-06-19 LAB — CMP (CANCER CENTER ONLY)
ALT: 70 U/L — ABNORMAL HIGH (ref 0–44)
AST: 30 U/L (ref 15–41)
Albumin: 2.9 g/dL — ABNORMAL LOW (ref 3.5–5.0)
Alkaline Phosphatase: 463 U/L — ABNORMAL HIGH (ref 38–126)
Anion gap: 9 (ref 5–15)
BUN: 18 mg/dL (ref 8–23)
CO2: 22 mmol/L (ref 22–32)
Calcium: 8.6 mg/dL — ABNORMAL LOW (ref 8.9–10.3)
Chloride: 99 mmol/L (ref 98–111)
Creatinine: 1.44 mg/dL — ABNORMAL HIGH (ref 0.61–1.24)
GFR, Estimated: 54 mL/min — ABNORMAL LOW (ref >60)
Glucose, Bld: 120 mg/dL — ABNORMAL HIGH (ref 70–99)
Potassium: 3.2 mmol/L — ABNORMAL LOW (ref 3.5–5.1)
Sodium: 130 mmol/L — ABNORMAL LOW (ref 135–145)
Total Bilirubin: 1.1 mg/dL (ref 0.3–1.2)
Total Protein: 7.4 g/dL (ref 6.5–8.1)

## 2023-06-19 LAB — CBC WITH DIFFERENTIAL (CANCER CENTER ONLY)
Abs Immature Granulocytes: 0.12 10*3/uL — ABNORMAL HIGH (ref 0.00–0.07)
Basophils Absolute: 0 10*3/uL (ref 0.0–0.1)
Basophils Relative: 0 %
Eosinophils Absolute: 0.1 10*3/uL (ref 0.0–0.5)
Eosinophils Relative: 1 %
HCT: 38.4 % — ABNORMAL LOW (ref 39.0–52.0)
Hemoglobin: 13 g/dL (ref 13.0–17.0)
Immature Granulocytes: 1 %
Lymphocytes Relative: 25 %
Lymphs Abs: 2.6 10*3/uL (ref 0.7–4.0)
MCH: 32.6 pg (ref 26.0–34.0)
MCHC: 33.9 g/dL (ref 30.0–36.0)
MCV: 96.2 fL (ref 80.0–100.0)
Monocytes Absolute: 0.5 10*3/uL (ref 0.1–1.0)
Monocytes Relative: 5 %
Neutro Abs: 6.8 10*3/uL (ref 1.7–7.7)
Neutrophils Relative %: 68 %
Platelet Count: 253 10*3/uL (ref 150–400)
RBC: 3.99 MIL/uL — ABNORMAL LOW (ref 4.22–5.81)
RDW: 13.8 % (ref 11.5–15.5)
WBC Count: 10.1 10*3/uL (ref 4.0–10.5)
nRBC: 0 % (ref 0.0–0.2)

## 2023-06-19 LAB — MAGNESIUM: Magnesium: 2.2 mg/dL (ref 1.7–2.4)

## 2023-06-19 MED ORDER — SODIUM CHLORIDE 0.9% FLUSH
10.0000 mL | Freq: Once | INTRAVENOUS | Status: AC | PRN
  Administered 2023-06-19: 10 mL
  Filled 2023-06-19: qty 10

## 2023-06-19 MED ORDER — HEPARIN SOD (PORK) LOCK FLUSH 100 UNIT/ML IV SOLN
500.0000 [IU] | Freq: Once | INTRAVENOUS | Status: AC | PRN
  Administered 2023-06-19: 500 [IU]
  Filled 2023-06-19: qty 5

## 2023-06-19 MED ORDER — PROCHLORPERAZINE EDISYLATE 10 MG/2ML IJ SOLN
10.0000 mg | Freq: Once | INTRAMUSCULAR | Status: AC
  Administered 2023-06-19: 10 mg via INTRAVENOUS

## 2023-06-19 MED ORDER — POTASSIUM CHLORIDE 20 MEQ/100ML IV SOLN
20.0000 meq | Freq: Once | INTRAVENOUS | Status: AC
  Administered 2023-06-19: 20 meq via INTRAVENOUS

## 2023-06-19 MED ORDER — SODIUM CHLORIDE 0.9 % IV SOLN
Freq: Once | INTRAVENOUS | Status: AC
  Filled 2023-06-19: qty 250

## 2023-06-22 ENCOUNTER — Encounter: Payer: Self-pay | Admitting: Internal Medicine

## 2023-06-22 ENCOUNTER — Inpatient Hospital Stay: Payer: Medicare PPO

## 2023-06-22 ENCOUNTER — Inpatient Hospital Stay (HOSPITAL_BASED_OUTPATIENT_CLINIC_OR_DEPARTMENT_OTHER): Payer: Medicare PPO | Admitting: Internal Medicine

## 2023-06-22 VITALS — BP 129/86 | HR 69 | Temp 98.2°F | Ht 70.0 in | Wt 174.8 lb

## 2023-06-22 DIAGNOSIS — C3412 Malignant neoplasm of upper lobe, left bronchus or lung: Secondary | ICD-10-CM

## 2023-06-22 DIAGNOSIS — Z5112 Encounter for antineoplastic immunotherapy: Secondary | ICD-10-CM | POA: Diagnosis not present

## 2023-06-22 LAB — CBC WITH DIFFERENTIAL (CANCER CENTER ONLY)
Abs Immature Granulocytes: 0.2 10*3/uL — ABNORMAL HIGH (ref 0.00–0.07)
Basophils Absolute: 0 10*3/uL (ref 0.0–0.1)
Basophils Relative: 0 %
Eosinophils Absolute: 0.1 10*3/uL (ref 0.0–0.5)
Eosinophils Relative: 1 %
HCT: 35.6 % — ABNORMAL LOW (ref 39.0–52.0)
Hemoglobin: 11.6 g/dL — ABNORMAL LOW (ref 13.0–17.0)
Immature Granulocytes: 2 %
Lymphocytes Relative: 24 %
Lymphs Abs: 2.7 10*3/uL (ref 0.7–4.0)
MCH: 32.4 pg (ref 26.0–34.0)
MCHC: 32.6 g/dL (ref 30.0–36.0)
MCV: 99.4 fL (ref 80.0–100.0)
Monocytes Absolute: 0.7 10*3/uL (ref 0.1–1.0)
Monocytes Relative: 6 %
Neutro Abs: 7.7 10*3/uL (ref 1.7–7.7)
Neutrophils Relative %: 67 %
Platelet Count: 242 10*3/uL (ref 150–400)
RBC: 3.58 MIL/uL — ABNORMAL LOW (ref 4.22–5.81)
RDW: 14 % (ref 11.5–15.5)
WBC Count: 11.4 10*3/uL — ABNORMAL HIGH (ref 4.0–10.5)
nRBC: 0 % (ref 0.0–0.2)

## 2023-06-22 LAB — CMP (CANCER CENTER ONLY)
ALT: 36 U/L (ref 0–44)
AST: 18 U/L (ref 15–41)
Albumin: 2.8 g/dL — ABNORMAL LOW (ref 3.5–5.0)
Alkaline Phosphatase: 274 U/L — ABNORMAL HIGH (ref 38–126)
Anion gap: 7 (ref 5–15)
BUN: 14 mg/dL (ref 8–23)
CO2: 23 mmol/L (ref 22–32)
Calcium: 8.5 mg/dL — ABNORMAL LOW (ref 8.9–10.3)
Chloride: 105 mmol/L (ref 98–111)
Creatinine: 1.58 mg/dL — ABNORMAL HIGH (ref 0.61–1.24)
GFR, Estimated: 48 mL/min — ABNORMAL LOW (ref >60)
Glucose, Bld: 117 mg/dL — ABNORMAL HIGH (ref 70–99)
Potassium: 3.5 mmol/L (ref 3.5–5.1)
Sodium: 135 mmol/L (ref 135–145)
Total Bilirubin: 0.2 mg/dL — ABNORMAL LOW (ref 0.3–1.2)
Total Protein: 6.8 g/dL (ref 6.5–8.1)

## 2023-06-22 LAB — LACTATE DEHYDROGENASE: LDH: 114 U/L (ref 98–192)

## 2023-06-22 MED ORDER — HEPARIN SOD (PORK) LOCK FLUSH 100 UNIT/ML IV SOLN
500.0000 [IU] | Freq: Once | INTRAVENOUS | Status: AC
  Administered 2023-06-22: 500 [IU] via INTRAVENOUS
  Filled 2023-06-22: qty 5

## 2023-06-22 MED ORDER — SODIUM CHLORIDE 0.9% FLUSH
10.0000 mL | Freq: Once | INTRAVENOUS | Status: AC
  Administered 2023-06-22: 10 mL via INTRAVENOUS
  Filled 2023-06-22: qty 10
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
## 2023-06-22 NOTE — Progress Notes (Signed)
HOLD tecentriq today; and de-access

## 2023-06-22 NOTE — Assessment & Plan Note (Signed)
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 PET 2nd FEB 2024-  4.9 cm mass in the left perihilar upper lobe obstructing the apical  left upper lobe bronchi with direct mediastinal invasion and left hilar  and aortopulmonary lymphadenopathy; Bilateral adrenal nodules and masses, multiple hypoattenuating lesions  in the liver and right renal nodule concerning for metastatic disease. Pleural nodules in the posterior left pleural space measuring up to 1.5  cm compatible with pleural metastases.    # currently on Atezolizumab-AUG 16th, 2024- Decreased size of left apical lung lesion and a posterior satellite nodule. A lateral satellite nodule has minimally enlarged- ? second primary. The central left upper lobe/suprahilar primary is no longer identified; Minimal improvement in hepatic metastasis. Similar osseous metastasis with healing of previously described right 5th rib pathologic fracture.  # HOLD Tecentriq every 3 weeks [given acute issues/ recent infection]- Labs-CBC/chemistries were reviewed with the patient- stable; will plan to start immunotherapy after WBRT.    # Hypothyroidism: sec to Tecentriq-asymptomatic-Continue/discussed compliance synthroid 100 mcg once a day. will recheck again.   # MRI Brain in AUG 2024- multiple lesions in the brain concerning for metastases.  Awaiting evaluation with radiation oncology on 0/18. Plan whole brain RT. Hold off dex for now.   # Pain- Right posterior fifth rib has an SUV max of 3.20. Right ninth posterior rib has an SUV max of 2.49. Right iliac bone lesion has an SUV max of 4; left femur lesion.  Discuss/ordered Zometa. vitamin D levels- pending.  If not better- recommend evaluation with radiation.  Percocet 7.5/325 1 to 2 pills every 12 hours--STABLE>   # COPD/cough- continue with inhalers.  Continue Tessalon Perles/ Tussionex prn. S/p  pulmonary Dr.Dgyali re: COPD- stable.# Smoking: Active smoker; recommend  quitting/cutting downSTABLE>   # Hoarseness of voice/ choking spells- s/p  ENT evaluation.s/p speech path evaluation- STABLE>   # Weight loss: Poor appetite- on zyprexa 5 mg at bedtime-STABLE>   # DISPOSITION: # HOLD  tecentriq today; and de-access  # follow up  in 3  weeks- MD; labs- cbc/cmp; LDH; Thyroid profile- -tecentriq;  Dr.B  # I reviewed the blood work- with the patient in detail; also reviewed the imaging independently [as summarized above]; and with the patient in detail.

## 2023-06-22 NOTE — Progress Notes (Signed)
Had the flu since last visit. Still has loose stools, nausea/vomiting.   Strength not 100%, wants to talk about holding off chemo today.

## 2023-06-23 LAB — THYROID PANEL WITH TSH
Free Thyroxine Index: 0.6 — ABNORMAL LOW (ref 1.2–4.9)
T3 Uptake Ratio: 22 % — ABNORMAL LOW (ref 24–39)
T4, Total: 2.8 ug/dL — ABNORMAL LOW (ref 4.5–12.0)
TSH: 60.8 u[IU]/mL — ABNORMAL HIGH (ref 0.450–4.500)

## 2023-06-23 NOTE — Progress Notes (Signed)
Left message to return call 

## 2023-06-24 ENCOUNTER — Ambulatory Visit
Admission: RE | Admit: 2023-06-24 | Discharge: 2023-06-24 | Disposition: A | Discharge status: Home / Self Care | Payer: Medicare PPO | Source: Ambulatory Visit | Attending: Radiation Oncology | Admitting: Radiation Oncology

## 2023-06-24 ENCOUNTER — Encounter: Payer: Self-pay | Admitting: Radiation Oncology

## 2023-06-24 VITALS — BP 137/83 | HR 68 | Temp 96.5°F | Resp 16 | Ht 71.0 in | Wt 174.0 lb

## 2023-06-24 DIAGNOSIS — C7931 Secondary malignant neoplasm of brain: Secondary | ICD-10-CM | POA: Insufficient documentation

## 2023-06-24 DIAGNOSIS — Z510 Encounter for antineoplastic radiation therapy: Secondary | ICD-10-CM | POA: Insufficient documentation

## 2023-06-24 DIAGNOSIS — C3412 Malignant neoplasm of upper lobe, left bronchus or lung: Secondary | ICD-10-CM | POA: Insufficient documentation

## 2023-06-24 DIAGNOSIS — C7951 Secondary malignant neoplasm of bone: Secondary | ICD-10-CM | POA: Insufficient documentation
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
## 2023-06-25 ENCOUNTER — Other Ambulatory Visit: Payer: Self-pay | Admitting: *Deleted

## 2023-06-25 ENCOUNTER — Ambulatory Visit
Admission: RE | Admit: 2023-06-25 | Discharge: 2023-06-25 | Disposition: A | Discharge status: Home / Self Care | Payer: Medicare PPO | Source: Ambulatory Visit | Attending: Radiation Oncology | Admitting: Radiation Oncology

## 2023-06-25 DIAGNOSIS — Z510 Encounter for antineoplastic radiation therapy: Secondary | ICD-10-CM | POA: Diagnosis present

## 2023-06-25 DIAGNOSIS — C3412 Malignant neoplasm of upper lobe, left bronchus or lung: Secondary | ICD-10-CM | POA: Diagnosis present

## 2023-06-25 DIAGNOSIS — C7951 Secondary malignant neoplasm of bone: Secondary | ICD-10-CM | POA: Diagnosis not present

## 2023-06-25 DIAGNOSIS — C7931 Secondary malignant neoplasm of brain: Secondary | ICD-10-CM | POA: Diagnosis present

## 2023-06-25 MED ORDER — DEXAMETHASONE 2 MG PO TABS: 2.0000 mg | ORAL_TABLET | Freq: Two times a day (BID) | ORAL | 0 refills | Status: DC

## 2023-06-25 NOTE — Progress Notes (Signed)
Pt notified by message

## 2023-06-26 ENCOUNTER — Other Ambulatory Visit: Payer: Self-pay | Admitting: *Deleted

## 2023-06-26 DIAGNOSIS — C7931 Secondary malignant neoplasm of brain: Secondary | ICD-10-CM

## 2023-06-27 DIAGNOSIS — Z510 Encounter for antineoplastic radiation therapy: Secondary | ICD-10-CM | POA: Diagnosis not present

## 2023-07-01 ENCOUNTER — Ambulatory Visit
Admission: RE | Admit: 2023-07-01 | Discharge: 2023-07-01 | Disposition: A | Discharge status: Home / Self Care | Payer: Medicare PPO | Source: Ambulatory Visit | Attending: Radiation Oncology | Admitting: Radiation Oncology

## 2023-07-01 DIAGNOSIS — Z510 Encounter for antineoplastic radiation therapy: Secondary | ICD-10-CM | POA: Diagnosis not present

## 2023-07-02 ENCOUNTER — Other Ambulatory Visit: Payer: Self-pay

## 2023-07-02 ENCOUNTER — Ambulatory Visit
Admission: RE | Admit: 2023-07-02 | Discharge: 2023-07-02 | Disposition: A | Discharge status: Home / Self Care | Payer: Medicare PPO | Source: Ambulatory Visit | Attending: Radiation Oncology | Admitting: Radiation Oncology

## 2023-07-02 DIAGNOSIS — Z510 Encounter for antineoplastic radiation therapy: Secondary | ICD-10-CM | POA: Diagnosis not present

## 2023-07-02 LAB — RAD ONC ARIA SESSION SUMMARY
Course Elapsed Days: 0
Plan Fractions Treated to Date: 1
Plan Prescribed Dose Per Fraction: 3 Gy
Plan Total Fractions Prescribed: 10
Plan Total Prescribed Dose: 30 Gy
Reference Point Dosage Given to Date: 3 Gy
Reference Point Session Dosage Given: 3 Gy
Session Number: 1

## 2023-07-03 ENCOUNTER — Ambulatory Visit
Admission: RE | Admit: 2023-07-03 | Discharge: 2023-07-03 | Disposition: A | Discharge status: Home / Self Care | Payer: Medicare PPO | Source: Ambulatory Visit | Attending: Radiation Oncology | Admitting: Radiation Oncology

## 2023-07-03 ENCOUNTER — Other Ambulatory Visit: Payer: Self-pay

## 2023-07-03 DIAGNOSIS — Z510 Encounter for antineoplastic radiation therapy: Secondary | ICD-10-CM | POA: Diagnosis not present

## 2023-07-03 LAB — RAD ONC ARIA SESSION SUMMARY
Course Elapsed Days: 1
Plan Fractions Treated to Date: 2
Plan Prescribed Dose Per Fraction: 3 Gy
Plan Total Fractions Prescribed: 10
Plan Total Prescribed Dose: 30 Gy
Reference Point Dosage Given to Date: 6 Gy
Reference Point Session Dosage Given: 3 Gy
Session Number: 2

## 2023-07-06 ENCOUNTER — Other Ambulatory Visit: Payer: Self-pay | Admitting: *Deleted

## 2023-07-06 ENCOUNTER — Other Ambulatory Visit: Payer: Self-pay

## 2023-07-06 ENCOUNTER — Ambulatory Visit
Admission: RE | Admit: 2023-07-06 | Discharge: 2023-07-06 | Disposition: A | Discharge status: Home / Self Care | Payer: Medicare PPO | Source: Ambulatory Visit | Attending: Radiation Oncology | Admitting: Radiation Oncology

## 2023-07-06 DIAGNOSIS — Z510 Encounter for antineoplastic radiation therapy: Secondary | ICD-10-CM | POA: Diagnosis not present

## 2023-07-06 LAB — RAD ONC ARIA SESSION SUMMARY
Course Elapsed Days: 4
Plan Fractions Treated to Date: 3
Plan Prescribed Dose Per Fraction: 3 Gy
Plan Total Fractions Prescribed: 10
Plan Total Prescribed Dose: 30 Gy
Reference Point Dosage Given to Date: 9 Gy
Reference Point Session Dosage Given: 3 Gy
Session Number: 3

## 2023-07-06 MED ORDER — OXYCODONE HCL 15 MG PO TABS: 15.0000 mg | ORAL_TABLET | Freq: Three times a day (TID) | ORAL | 0 refills | Status: DC | PRN

## 2023-07-07 ENCOUNTER — Ambulatory Visit
Admission: RE | Admit: 2023-07-07 | Discharge: 2023-07-07 | Disposition: A | Discharge status: Home / Self Care | Payer: Medicare PPO | Source: Ambulatory Visit | Attending: Radiation Oncology | Admitting: Radiation Oncology

## 2023-07-07 ENCOUNTER — Other Ambulatory Visit: Payer: Self-pay

## 2023-07-07 DIAGNOSIS — C3412 Malignant neoplasm of upper lobe, left bronchus or lung: Secondary | ICD-10-CM | POA: Insufficient documentation

## 2023-07-07 DIAGNOSIS — C7931 Secondary malignant neoplasm of brain: Secondary | ICD-10-CM | POA: Insufficient documentation

## 2023-07-07 DIAGNOSIS — Z510 Encounter for antineoplastic radiation therapy: Secondary | ICD-10-CM | POA: Insufficient documentation

## 2023-07-07 DIAGNOSIS — C7951 Secondary malignant neoplasm of bone: Secondary | ICD-10-CM | POA: Diagnosis not present

## 2023-07-07 DIAGNOSIS — E039 Hypothyroidism, unspecified: Secondary | ICD-10-CM | POA: Insufficient documentation

## 2023-07-07 DIAGNOSIS — F17210 Nicotine dependence, cigarettes, uncomplicated: Secondary | ICD-10-CM | POA: Diagnosis not present

## 2023-07-07 DIAGNOSIS — C787 Secondary malignant neoplasm of liver and intrahepatic bile duct: Secondary | ICD-10-CM | POA: Insufficient documentation

## 2023-07-07 LAB — RAD ONC ARIA SESSION SUMMARY
Course Elapsed Days: 5
Plan Fractions Treated to Date: 4
Plan Prescribed Dose Per Fraction: 3 Gy
Plan Total Fractions Prescribed: 10
Plan Total Prescribed Dose: 30 Gy
Reference Point Dosage Given to Date: 12 Gy
Reference Point Session Dosage Given: 3 Gy
Session Number: 4

## 2023-07-08 ENCOUNTER — Inpatient Hospital Stay: Payer: Medicare PPO | Attending: Internal Medicine

## 2023-07-08 ENCOUNTER — Other Ambulatory Visit: Payer: Self-pay

## 2023-07-08 ENCOUNTER — Ambulatory Visit
Admission: RE | Admit: 2023-07-08 | Discharge: 2023-07-08 | Disposition: A | Discharge status: Home / Self Care | Payer: Medicare PPO | Source: Ambulatory Visit | Attending: Radiation Oncology | Admitting: Radiation Oncology

## 2023-07-08 DIAGNOSIS — Z79899 Other long term (current) drug therapy: Secondary | ICD-10-CM | POA: Insufficient documentation

## 2023-07-08 DIAGNOSIS — C7951 Secondary malignant neoplasm of bone: Secondary | ICD-10-CM | POA: Insufficient documentation

## 2023-07-08 DIAGNOSIS — Z510 Encounter for antineoplastic radiation therapy: Secondary | ICD-10-CM | POA: Diagnosis not present

## 2023-07-08 DIAGNOSIS — C787 Secondary malignant neoplasm of liver and intrahepatic bile duct: Secondary | ICD-10-CM | POA: Insufficient documentation

## 2023-07-08 DIAGNOSIS — E039 Hypothyroidism, unspecified: Secondary | ICD-10-CM | POA: Insufficient documentation

## 2023-07-08 DIAGNOSIS — F17210 Nicotine dependence, cigarettes, uncomplicated: Secondary | ICD-10-CM | POA: Insufficient documentation

## 2023-07-08 DIAGNOSIS — C3412 Malignant neoplasm of upper lobe, left bronchus or lung: Secondary | ICD-10-CM | POA: Insufficient documentation

## 2023-07-08 LAB — RAD ONC ARIA SESSION SUMMARY
Course Elapsed Days: 6
Plan Fractions Treated to Date: 5
Plan Prescribed Dose Per Fraction: 3 Gy
Plan Total Fractions Prescribed: 10
Plan Total Prescribed Dose: 30 Gy
Reference Point Dosage Given to Date: 15 Gy
Reference Point Session Dosage Given: 3 Gy
Session Number: 5

## 2023-07-09 ENCOUNTER — Other Ambulatory Visit: Payer: Self-pay

## 2023-07-09 ENCOUNTER — Ambulatory Visit
Admission: RE | Admit: 2023-07-09 | Discharge: 2023-07-09 | Disposition: A | Discharge status: Home / Self Care | Payer: Medicare PPO | Source: Ambulatory Visit | Attending: Radiation Oncology | Admitting: Radiation Oncology

## 2023-07-09 DIAGNOSIS — Z510 Encounter for antineoplastic radiation therapy: Secondary | ICD-10-CM | POA: Diagnosis not present

## 2023-07-09 LAB — RAD ONC ARIA SESSION SUMMARY
Course Elapsed Days: 7
Plan Fractions Treated to Date: 6
Plan Prescribed Dose Per Fraction: 3 Gy
Plan Total Fractions Prescribed: 10
Plan Total Prescribed Dose: 30 Gy
Reference Point Dosage Given to Date: 18 Gy
Reference Point Session Dosage Given: 3 Gy
Session Number: 6

## 2023-07-10 ENCOUNTER — Other Ambulatory Visit: Payer: Self-pay

## 2023-07-10 ENCOUNTER — Ambulatory Visit
Admission: RE | Admit: 2023-07-10 | Discharge: 2023-07-10 | Disposition: A | Discharge status: Home / Self Care | Payer: Medicare PPO | Source: Ambulatory Visit | Attending: Radiation Oncology | Admitting: Radiation Oncology

## 2023-07-10 DIAGNOSIS — Z510 Encounter for antineoplastic radiation therapy: Secondary | ICD-10-CM | POA: Diagnosis not present

## 2023-07-10 LAB — RAD ONC ARIA SESSION SUMMARY
Course Elapsed Days: 8
Plan Fractions Treated to Date: 7
Plan Prescribed Dose Per Fraction: 3 Gy
Plan Total Fractions Prescribed: 10
Plan Total Prescribed Dose: 30 Gy
Reference Point Dosage Given to Date: 21 Gy
Reference Point Session Dosage Given: 3 Gy
Session Number: 7

## 2023-07-13 ENCOUNTER — Inpatient Hospital Stay: Payer: Medicare PPO

## 2023-07-13 ENCOUNTER — Inpatient Hospital Stay (HOSPITAL_BASED_OUTPATIENT_CLINIC_OR_DEPARTMENT_OTHER): Payer: Medicare PPO | Admitting: Internal Medicine

## 2023-07-13 ENCOUNTER — Ambulatory Visit
Admission: RE | Admit: 2023-07-13 | Discharge: 2023-07-13 | Disposition: A | Discharge status: Home / Self Care | Payer: Medicare PPO | Source: Ambulatory Visit | Attending: Radiation Oncology | Admitting: Radiation Oncology

## 2023-07-13 ENCOUNTER — Other Ambulatory Visit: Payer: Self-pay

## 2023-07-13 ENCOUNTER — Encounter: Payer: Self-pay | Admitting: Internal Medicine

## 2023-07-13 VITALS — BP 131/98 | HR 74 | Temp 97.9°F | Ht 71.0 in | Wt 177.0 lb

## 2023-07-13 DIAGNOSIS — C3490 Malignant neoplasm of unspecified part of unspecified bronchus or lung: Secondary | ICD-10-CM

## 2023-07-13 DIAGNOSIS — Z510 Encounter for antineoplastic radiation therapy: Secondary | ICD-10-CM | POA: Diagnosis not present

## 2023-07-13 DIAGNOSIS — C3412 Malignant neoplasm of upper lobe, left bronchus or lung: Secondary | ICD-10-CM

## 2023-07-13 LAB — LACTATE DEHYDROGENASE: LDH: 134 U/L (ref 98–192)

## 2023-07-13 LAB — CMP (CANCER CENTER ONLY)
ALT: 17 U/L (ref 0–44)
AST: 18 U/L (ref 15–41)
Albumin: 3.5 g/dL (ref 3.5–5.0)
Alkaline Phosphatase: 105 U/L (ref 38–126)
Anion gap: 7 (ref 5–15)
BUN: 27 mg/dL — ABNORMAL HIGH (ref 8–23)
CO2: 24 mmol/L (ref 22–32)
Calcium: 8.7 mg/dL — ABNORMAL LOW (ref 8.9–10.3)
Chloride: 104 mmol/L (ref 98–111)
Creatinine: 1.43 mg/dL — ABNORMAL HIGH (ref 0.61–1.24)
GFR, Estimated: 54 mL/min — ABNORMAL LOW (ref >60)
Glucose, Bld: 125 mg/dL — ABNORMAL HIGH (ref 70–99)
Potassium: 3.9 mmol/L (ref 3.5–5.1)
Sodium: 135 mmol/L (ref 135–145)
Total Bilirubin: 0.4 mg/dL (ref 0.3–1.2)
Total Protein: 6.7 g/dL (ref 6.5–8.1)

## 2023-07-13 LAB — CBC WITH DIFFERENTIAL (CANCER CENTER ONLY)
Abs Immature Granulocytes: 0.24 10*3/uL — ABNORMAL HIGH (ref 0.00–0.07)
Basophils Absolute: 0 10*3/uL (ref 0.0–0.1)
Basophils Relative: 0 %
Eosinophils Absolute: 0 10*3/uL (ref 0.0–0.5)
Eosinophils Relative: 0 %
HCT: 36.4 % — ABNORMAL LOW (ref 39.0–52.0)
Hemoglobin: 12 g/dL — ABNORMAL LOW (ref 13.0–17.0)
Immature Granulocytes: 2 %
Lymphocytes Relative: 15 %
Lymphs Abs: 1.7 10*3/uL (ref 0.7–4.0)
MCH: 33.1 pg (ref 26.0–34.0)
MCHC: 33 g/dL (ref 30.0–36.0)
MCV: 100.3 fL — ABNORMAL HIGH (ref 80.0–100.0)
Monocytes Absolute: 0.7 10*3/uL (ref 0.1–1.0)
Monocytes Relative: 6 %
Neutro Abs: 8.6 10*3/uL — ABNORMAL HIGH (ref 1.7–7.7)
Neutrophils Relative %: 77 %
Platelet Count: 217 10*3/uL (ref 150–400)
RBC: 3.63 MIL/uL — ABNORMAL LOW (ref 4.22–5.81)
RDW: 15.9 % — ABNORMAL HIGH (ref 11.5–15.5)
WBC Count: 11.2 10*3/uL — ABNORMAL HIGH (ref 4.0–10.5)
nRBC: 0 % (ref 0.0–0.2)

## 2023-07-13 LAB — RAD ONC ARIA SESSION SUMMARY
Course Elapsed Days: 11
Plan Fractions Treated to Date: 8
Plan Prescribed Dose Per Fraction: 3 Gy
Plan Total Fractions Prescribed: 10
Plan Total Prescribed Dose: 30 Gy
Reference Point Dosage Given to Date: 24 Gy
Reference Point Session Dosage Given: 3 Gy
Session Number: 8

## 2023-07-13 MED ORDER — FINASTERIDE 5 MG PO TABS: 5.0000 mg | ORAL_TABLET | Freq: Every day | ORAL | 1 refills | Status: DC

## 2023-07-13 MED ORDER — HEPARIN SOD (PORK) LOCK FLUSH 100 UNIT/ML IV SOLN
500.0000 [IU] | Freq: Once | INTRAVENOUS | Status: AC
  Administered 2023-07-13: 500 [IU] via INTRAVENOUS
  Filled 2023-07-13: qty 5
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
## 2023-07-13 NOTE — Progress Notes (Signed)
Pt took his last finasteride, wants to know if needs to keep taking, if so, needs refill.

## 2023-07-13 NOTE — Assessment & Plan Note (Addendum)
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 PET 2nd FEB 2024-  4.9 cm mass in the left perihilar upper lobe obstructing the apical  left upper lobe bronchi with direct mediastinal invasion and left hilar  and aortopulmonary lymphadenopathy; Bilateral adrenal nodules and masses, multiple hypoattenuating lesions  in the liver and right renal nodule concerning for metastatic disease. Pleural nodules in the posterior left pleural space measuring up to 1.5  cm compatible with pleural metastases.    # currently on Atezolizumab-AUG 16th, 2024- Decreased size of left apical lung lesion and a posterior satellite nodule. A lateral satellite nodule has minimally enlarged- ? second primary. The central left upper lobe/suprahilar primary is no longer identified; Minimal improvement in hepatic metastasis. Similar osseous metastasis with healing of previously described right 5th rib pathologic fracture.  # HOLD Tecentriq every 3 weeks [ongoing for brain radiation.]- Labs-CBC/chemistries were reviewed with the patient- stable; will plan to start immunotherapy after WBRT in 4-5 weeks..  Will repeat CT scan in 4-5 weeks; and then re-assess for chemo.   # Hypothyroidism: sec to Tecentriq-asymptomatic-Continue/discussed compliance synthroid 100 mcg once a day.  Clinically stable.  # MRI Brain in AUG 2024- multiple lesions in the brain concerning for metastases. Currently on whole brain RT [10/09-last].  Continue dex 2mg  BID start taper tomorrow. STABLE> and plan for imaging about 2 to 3 months post radiation.  # Pain- Right posterior fifth rib has an SUV max of 3.20. Right ninth posterior rib has an SUV max of 2.49. Right iliac bone lesion has an SUV max of 4; left femur lesion.  Discuss/ordered Zometa. vitamin D levels- pending.  If not better- recommend evaluation with radiation.  Percocet 7.5/325 1 to 2 pills every 12 hours--STABLE>   # COPD/cough- continue with inhalers.   Continue Tessalon Perles/ Tussionex prn. S/p  pulmonary Dr.Dgyali re: COPD- stable.  # Smoking: Active smoker; recommend quitting/cutting downSTABLE>   # Hoarseness of voice/ choking spells- s/p  ENT evaluation.s/p speech path evaluation- STABLE>   # Weight loss: Poor appetite- on zyprexa 5 mg at bedtime-STABLE>   # DISPOSITION: # HOLD  tecentriq today; and de-access  # follow up  in 5 weeks- MD; labs- cbc/cmp; LDH; Thyroid profile- -tecentriq; CT CAP -  Dr.B

## 2023-07-14 ENCOUNTER — Ambulatory Visit
Admission: RE | Admit: 2023-07-14 | Discharge: 2023-07-14 | Disposition: A | Discharge status: Home / Self Care | Payer: Medicare PPO | Source: Ambulatory Visit | Attending: Radiation Oncology | Admitting: Radiation Oncology

## 2023-07-14 ENCOUNTER — Other Ambulatory Visit: Payer: Self-pay

## 2023-07-14 DIAGNOSIS — Z510 Encounter for antineoplastic radiation therapy: Secondary | ICD-10-CM | POA: Diagnosis not present

## 2023-07-14 LAB — RAD ONC ARIA SESSION SUMMARY
Course Elapsed Days: 12
Plan Fractions Treated to Date: 9
Plan Prescribed Dose Per Fraction: 3 Gy
Plan Total Fractions Prescribed: 10
Plan Total Prescribed Dose: 30 Gy
Reference Point Dosage Given to Date: 27 Gy
Reference Point Session Dosage Given: 3 Gy
Session Number: 9

## 2023-07-14 LAB — THYROID PANEL WITH TSH
Free Thyroxine Index: 1.1 — ABNORMAL LOW (ref 1.2–4.9)
T3 Uptake Ratio: 22 % — ABNORMAL LOW (ref 24–39)
T4, Total: 4.9 ug/dL (ref 4.5–12.0)
TSH: 7.34 u[IU]/mL — ABNORMAL HIGH (ref 0.450–4.500)

## 2023-07-15 ENCOUNTER — Ambulatory Visit
Admission: RE | Admit: 2023-07-15 | Discharge: 2023-07-15 | Disposition: A | Discharge status: Home / Self Care | Payer: Medicare PPO | Source: Ambulatory Visit | Attending: Radiation Oncology | Admitting: Radiation Oncology

## 2023-07-15 ENCOUNTER — Other Ambulatory Visit: Payer: Self-pay | Admitting: *Deleted

## 2023-07-15 ENCOUNTER — Other Ambulatory Visit: Payer: Self-pay

## 2023-07-15 ENCOUNTER — Inpatient Hospital Stay: Payer: Medicare PPO

## 2023-07-15 DIAGNOSIS — Z510 Encounter for antineoplastic radiation therapy: Secondary | ICD-10-CM | POA: Diagnosis not present

## 2023-07-15 LAB — RAD ONC ARIA SESSION SUMMARY
Course Elapsed Days: 13
Plan Fractions Treated to Date: 10
Plan Prescribed Dose Per Fraction: 3 Gy
Plan Total Fractions Prescribed: 10
Plan Total Prescribed Dose: 30 Gy
Reference Point Dosage Given to Date: 30 Gy
Reference Point Session Dosage Given: 3 Gy
Session Number: 10

## 2023-07-15 MED ORDER — DEXAMETHASONE 2 MG PO TABS: 2.0000 mg | ORAL_TABLET | Freq: Every day | ORAL | 0 refills | Status: DC

## 2023-08-04 ENCOUNTER — Inpatient Hospital Stay (HOSPITAL_BASED_OUTPATIENT_CLINIC_OR_DEPARTMENT_OTHER): Payer: Medicare PPO | Admitting: Hospice and Palliative Medicine

## 2023-08-04 DIAGNOSIS — C3412 Malignant neoplasm of upper lobe, left bronchus or lung: Secondary | ICD-10-CM

## 2023-08-04 DIAGNOSIS — Z515 Encounter for palliative care: Secondary | ICD-10-CM

## 2023-08-04 NOTE — Progress Notes (Signed)
VM left. Will reschedule. 

## 2023-08-05 ENCOUNTER — Other Ambulatory Visit: Payer: Self-pay | Admitting: Internal Medicine

## 2023-08-05 NOTE — Telephone Encounter (Signed)
  Component Ref Range & Units 3 wk ago (07/13/23) 1 mo ago (06/22/23) 2 mo ago (06/01/23) 2 mo ago (05/11/23) 3 mo ago (04/20/23) 3 mo ago (04/20/23) 4 mo ago (03/30/23)  TSH 0.450 - 4.500 uIU/mL 7.340 High  60.800 High  61.800 High  68.500 High   1.784 R, CM <0.005 Low   T4, Total 4.5 - 12.0 ug/dL 4.9 2.8 Low  4.2 Low  1.4 Low  4.5 CM  10.7  T3 Uptake Ratio 24 - 39 % 22 Low  22 Low  20 Low  16 Low    31  Free Thyroxine Index 1.2 - 4.9 1.1 Low  0.6 Low  CM 0.8 Low  CM 0.2 Low  CM   3.3 CM  Comment: (NOTE) Performed At: [facility] [address] [place], [place] [id number] Jolene Schimke MD WU:[id number]  Resulting Agency [facility]         Specimen Collected: 07/13/23 09:41 Last Resulted: 07/14/23 08:37

## 2023-08-06 ENCOUNTER — Encounter: Payer: Self-pay | Admitting: Internal Medicine

## 2023-08-11 ENCOUNTER — Ambulatory Visit
Admission: RE | Admit: 2023-08-11 | Discharge: 2023-08-11 | Disposition: A | Discharge status: Home / Self Care | Payer: Medicare PPO | Source: Ambulatory Visit | Attending: Internal Medicine | Admitting: Internal Medicine

## 2023-08-11 DIAGNOSIS — C3490 Malignant neoplasm of unspecified part of unspecified bronchus or lung: Secondary | ICD-10-CM | POA: Diagnosis present

## 2023-08-11 DIAGNOSIS — C3412 Malignant neoplasm of upper lobe, left bronchus or lung: Secondary | ICD-10-CM | POA: Insufficient documentation

## 2023-08-11 MED ORDER — IOHEXOL 300 MG/ML  SOLN
100.0000 mL | Freq: Once | INTRAMUSCULAR | Status: AC | PRN
  Administered 2023-08-11: 100 mL via INTRAVENOUS

## 2023-08-14 ENCOUNTER — Other Ambulatory Visit: Payer: Self-pay | Admitting: Internal Medicine

## 2023-08-18 ENCOUNTER — Inpatient Hospital Stay: Payer: Medicare PPO

## 2023-08-18 ENCOUNTER — Encounter: Payer: Self-pay | Admitting: Internal Medicine

## 2023-08-18 ENCOUNTER — Inpatient Hospital Stay: Payer: Medicare PPO | Attending: Internal Medicine

## 2023-08-18 ENCOUNTER — Inpatient Hospital Stay (HOSPITAL_BASED_OUTPATIENT_CLINIC_OR_DEPARTMENT_OTHER): Payer: Medicare PPO | Admitting: Internal Medicine

## 2023-08-18 VITALS — BP 131/88 | HR 67 | Temp 96.8°F | Ht 71.0 in | Wt 173.1 lb

## 2023-08-18 DIAGNOSIS — C3412 Malignant neoplasm of upper lobe, left bronchus or lung: Secondary | ICD-10-CM

## 2023-08-18 DIAGNOSIS — R590 Localized enlarged lymph nodes: Secondary | ICD-10-CM | POA: Diagnosis not present

## 2023-08-18 DIAGNOSIS — C7951 Secondary malignant neoplasm of bone: Secondary | ICD-10-CM | POA: Diagnosis not present

## 2023-08-18 DIAGNOSIS — Z5112 Encounter for antineoplastic immunotherapy: Secondary | ICD-10-CM | POA: Diagnosis present

## 2023-08-18 DIAGNOSIS — C3490 Malignant neoplasm of unspecified part of unspecified bronchus or lung: Secondary | ICD-10-CM

## 2023-08-18 DIAGNOSIS — F17210 Nicotine dependence, cigarettes, uncomplicated: Secondary | ICD-10-CM | POA: Diagnosis not present

## 2023-08-18 DIAGNOSIS — C7970 Secondary malignant neoplasm of unspecified adrenal gland: Secondary | ICD-10-CM | POA: Insufficient documentation

## 2023-08-18 DIAGNOSIS — E039 Hypothyroidism, unspecified: Secondary | ICD-10-CM | POA: Diagnosis not present

## 2023-08-18 DIAGNOSIS — C787 Secondary malignant neoplasm of liver and intrahepatic bile duct: Secondary | ICD-10-CM | POA: Insufficient documentation

## 2023-08-18 DIAGNOSIS — Z79899 Other long term (current) drug therapy: Secondary | ICD-10-CM | POA: Insufficient documentation

## 2023-08-18 LAB — CBC WITH DIFFERENTIAL (CANCER CENTER ONLY)
Abs Immature Granulocytes: 0.04 10*3/uL (ref 0.00–0.07)
Basophils Absolute: 0 10*3/uL (ref 0.0–0.1)
Basophils Relative: 0 %
Eosinophils Absolute: 0.1 10*3/uL (ref 0.0–0.5)
Eosinophils Relative: 1 %
HCT: 35.9 % — ABNORMAL LOW (ref 39.0–52.0)
Hemoglobin: 12 g/dL — ABNORMAL LOW (ref 13.0–17.0)
Immature Granulocytes: 1 %
Lymphocytes Relative: 35 %
Lymphs Abs: 2.6 10*3/uL (ref 0.7–4.0)
MCH: 33.5 pg (ref 26.0–34.0)
MCHC: 33.4 g/dL (ref 30.0–36.0)
MCV: 100.3 fL — ABNORMAL HIGH (ref 80.0–100.0)
Monocytes Absolute: 0.6 10*3/uL (ref 0.1–1.0)
Monocytes Relative: 8 %
Neutro Abs: 4 10*3/uL (ref 1.7–7.7)
Neutrophils Relative %: 55 %
Platelet Count: 241 10*3/uL (ref 150–400)
RBC: 3.58 MIL/uL — ABNORMAL LOW (ref 4.22–5.81)
RDW: 15.8 % — ABNORMAL HIGH (ref 11.5–15.5)
WBC Count: 7.3 10*3/uL (ref 4.0–10.5)
nRBC: 0 % (ref 0.0–0.2)

## 2023-08-18 LAB — CMP (CANCER CENTER ONLY)
ALT: 13 U/L (ref 0–44)
AST: 17 U/L (ref 15–41)
Albumin: 3.6 g/dL (ref 3.5–5.0)
Alkaline Phosphatase: 72 U/L (ref 38–126)
Anion gap: 8 (ref 5–15)
BUN: 18 mg/dL (ref 8–23)
CO2: 24 mmol/L (ref 22–32)
Calcium: 9 mg/dL (ref 8.9–10.3)
Chloride: 106 mmol/L (ref 98–111)
Creatinine: 1.11 mg/dL (ref 0.61–1.24)
GFR, Estimated: >60 mL/min (ref >60)
Glucose, Bld: 93 mg/dL (ref 70–99)
Potassium: 3.7 mmol/L (ref 3.5–5.1)
Sodium: 138 mmol/L (ref 135–145)
Total Bilirubin: 0.5 mg/dL (ref <1.2)
Total Protein: 6.9 g/dL (ref 6.5–8.1)

## 2023-08-18 LAB — LACTATE DEHYDROGENASE: LDH: 156 U/L (ref 98–192)

## 2023-08-18 MED ORDER — HEPARIN SOD (PORK) LOCK FLUSH 100 UNIT/ML IV SOLN
500.0000 [IU] | Freq: Once | INTRAVENOUS | Status: DC | PRN
  Filled 2023-08-18: qty 5

## 2023-08-18 MED ORDER — SODIUM CHLORIDE 0.9 % IV SOLN
1200.0000 mg | Freq: Once | INTRAVENOUS | Status: AC
  Administered 2023-08-18: 1200 mg via INTRAVENOUS
  Filled 2023-08-18: qty 20

## 2023-08-18 MED ORDER — SODIUM CHLORIDE 0.9 % IV SOLN
Freq: Once | INTRAVENOUS | Status: AC
  Filled 2023-08-18: qty 250

## 2023-08-18 NOTE — Assessment & Plan Note (Addendum)
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 Pt finished WBRT in OCT 2024.   #  NOV 5th, 2024- CT CAP-  Similar irregular nodule of the central left upper lobe. Interval enlargement of an adjacent satellite nodule in the left upper lobe as well as new subpleural nodule of the peripheral left upper lobe;  Interval increase in diffuse, matted soft tissue thickening throughout the left hilum and mediastinum. Similar enlargement of pretracheal and right hilar lymph nodes. Newly enlarged right adrenal nodule. Unchanged left adrenal nodule; Numerous newly enlarged retroperitoneal lymph nodes;  Constellation of findings is consistent with primary lung malignancy and worsened pulmonary, nodal, and adrenal metastatic disease. Last Atezo- AUG 26th, 2024.   # Discussed regarding progression of disease-he will need to start systemic therapy.   I suspect patient would have poor tolerance to platinum based chemotherapy again.  Continue immunotherapy for now.  Will plan to scan again in 2 months or so if progression of disease would recommend second line chemotherapy.  # Patient to RE-start atezolizumab  Today. Labs-CBC/chemistries were reviewed with the patient.  # # MRI Brain in AUG 2024- multiple lesions in the brain concerning for metastases. Currently on whole brain RT [10/09-last].   Stable. and plan for imaging about 2 to 3 months post radiation- defer to Dr.Chrystal. sees tomorrow.   # Hypothyroidism: sec to Tecentriq-asymptomatic-Continue/discussed compliance synthroid 100 mcg once a day.  Clinically stable.  # Pain- Right posterior fifth rib has an SUV max of 3.20. Right ninth posterior rib has an SUV max of 2.49. Right iliac bone lesion has an SUV max of 4; left femur lesion.  Discuss/ordered Zometa. vitamin D levels- pending.  If not better- recommend evaluation with radiation.  Percocet 7.5/325 1 to 2 pills  every 12 hours-- stable.   # COPD/cough- continue with inhalers.  Continue Tessalon Perles/ Tussionex prn. S/p  pulmonary Dr.Dgyali re: COPD- stable.  # Smoking: Active smoker; recommend quitting/cutting downSTABLE>   # Hoarseness of voice/ choking spells- s/p  ENT evaluation.s/p speech path evaluation- STABLE>   # Weight loss: Poor appetite- on zyprexa 5 mg at bedtime-STABLE>   # DISPOSITION: # Tecentriq today; # follow up  in 3  weeks- MD; labs- cbc/cmp; LDH;Tecentriq;Dr.B  # I reviewed the blood work- with the patient in detail; also reviewed the imaging independently [as summarized above]; and with the patient in detail.
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
## 2023-08-18 NOTE — Progress Notes (Signed)
Since last visit, "he caught a bug". Vomiting, fever, diarrhea. Lost 4lbs.  CT CAP 08/11/23.  Appetite 75% normal, 1 boost per day.

## 2023-08-18 NOTE — Patient Instructions (Signed)
[facility] - A DEPT OF [facility]  Discharge Instructions: Thank you for choosing [facility] to provide your oncology and hematology care.  If you have a lab appointment with the Cancer Center, please go directly to the Cancer Center and check in at the registration area.  Wear comfortable clothing and clothing appropriate for easy access to any Portacath or PICC line.   We strive to give you quality time with your provider. You may need to reschedule your appointment if you arrive late (15 or more minutes).  Arriving late affects you and other patients whose appointments are after yours.  Also, if you miss three or more appointments without notifying the office, you may be dismissed from the clinic at the provider's discretion.      For prescription refill requests, have your pharmacy contact our office and allow 72 hours for refills to be completed.    Today you received the following chemotherapy and/or immunotherapy agents Tecentriq      To help prevent nausea and vomiting after your treatment, we encourage you to take your nausea medication as directed.  BELOW ARE SYMPTOMS THAT SHOULD BE REPORTED IMMEDIATELY: *FEVER GREATER THAN 100.4 F (38 C) OR HIGHER *CHILLS OR SWEATING *NAUSEA AND VOMITING THAT IS NOT CONTROLLED WITH YOUR NAUSEA MEDICATION *UNUSUAL SHORTNESS OF BREATH *UNUSUAL BRUISING OR BLEEDING *URINARY PROBLEMS (pain or burning when urinating, or frequent urination) *BOWEL PROBLEMS (unusual diarrhea, constipation, pain near the anus) TENDERNESS IN MOUTH AND THROAT WITH OR WITHOUT PRESENCE OF ULCERS (sore throat, sores in mouth, or a toothache) UNUSUAL RASH, SWELLING OR PAIN  UNUSUAL VAGINAL DISCHARGE OR ITCHING   Items with * indicate a potential emergency and should be followed up as soon as possible or go to the Emergency Department if any problems should occur.  Please show the CHEMOTHERAPY ALERT CARD or IMMUNOTHERAPY  ALERT CARD at check-in to the Emergency Department and triage nurse.  Should you have questions after your visit or need to cancel or reschedule your appointment, please contact [facility] - A DEPT OF [facility]  [phone] and follow the prompts.  Office hours are 8:00 a.m. to 4:30 p.m. Monday - Friday. Please note that voicemails left after 4:00 p.m. may not be returned until the following business day.  We are closed weekends and major holidays. You have access to a nurse at all times for urgent questions. Please call the main number to the clinic [phone] and follow the prompts.  For any non-urgent questions, you may also contact your provider using MyChart. We now offer e-Visits for anyone 89 and older to request care online for non-urgent symptoms. For details visit [email or web address].   Also download the MyChart app! Go to the app store, search "MyChart", open the app, select [facility], and log in with your MyChart username and password.

## 2023-08-19 ENCOUNTER — Encounter: Payer: Self-pay | Admitting: Radiation Oncology

## 2023-08-19 ENCOUNTER — Ambulatory Visit
Admission: RE | Admit: 2023-08-19 | Discharge: 2023-08-19 | Disposition: A | Discharge status: Home / Self Care | Payer: Medicare PPO | Source: Ambulatory Visit | Attending: Radiation Oncology | Admitting: Radiation Oncology

## 2023-08-19 VITALS — BP 138/91 | HR 78 | Temp 97.6°F | Resp 16 | Wt 174.0 lb

## 2023-08-19 DIAGNOSIS — Z923 Personal history of irradiation: Secondary | ICD-10-CM | POA: Diagnosis not present

## 2023-08-19 DIAGNOSIS — G8929 Other chronic pain: Secondary | ICD-10-CM | POA: Insufficient documentation

## 2023-08-19 DIAGNOSIS — C7931 Secondary malignant neoplasm of brain: Secondary | ICD-10-CM | POA: Insufficient documentation

## 2023-08-19 DIAGNOSIS — C3490 Malignant neoplasm of unspecified part of unspecified bronchus or lung: Secondary | ICD-10-CM | POA: Insufficient documentation

## 2023-08-19 DIAGNOSIS — M25559 Pain in unspecified hip: Secondary | ICD-10-CM | POA: Insufficient documentation

## 2023-08-19 DIAGNOSIS — C7970 Secondary malignant neoplasm of unspecified adrenal gland: Secondary | ICD-10-CM | POA: Insufficient documentation

## 2023-08-19 DIAGNOSIS — C778 Secondary and unspecified malignant neoplasm of lymph nodes of multiple regions: Secondary | ICD-10-CM | POA: Diagnosis not present

## 2023-08-19 LAB — THYROID PANEL WITH TSH
Free Thyroxine Index: 1.4 (ref 1.2–4.9)
T3 Uptake Ratio: 22 % — ABNORMAL LOW (ref 24–39)
T4, Total: 6.5 ug/dL (ref 4.5–12.0)
TSH: 39.2 u[IU]/mL — ABNORMAL HIGH (ref 0.450–4.500)
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
## 2023-08-20 ENCOUNTER — Telehealth: Payer: Self-pay | Admitting: *Deleted

## 2023-08-20 ENCOUNTER — Other Ambulatory Visit: Payer: Self-pay | Admitting: *Deleted

## 2023-08-20 MED ORDER — LEVOTHYROXINE SODIUM 150 MCG PO TABS: 150.0000 ug | ORAL_TABLET | Freq: Every day | ORAL | 1 refills | Status: DC

## 2023-08-20 NOTE — Telephone Encounter (Signed)
Called pt to verify he is taking his synthroid correctly. He is taking in the wee hours of the morning completely by itself and does not eat or drink anything afterwards for about 2 hours. Informed pt that Dr B is increasing the dosage. We have sent a new prescription to [facility].Stop taking the 100 microgram tablets and start taking the new 150 microgram tablets.

## 2023-08-31 ENCOUNTER — Other Ambulatory Visit: Payer: Self-pay | Admitting: Hospice and Palliative Medicine

## 2023-09-01 ENCOUNTER — Other Ambulatory Visit: Payer: Self-pay | Admitting: Hospice and Palliative Medicine

## 2023-09-02 ENCOUNTER — Other Ambulatory Visit: Payer: Self-pay | Admitting: Hospice and Palliative Medicine

## 2023-09-08 ENCOUNTER — Inpatient Hospital Stay: Payer: Medicare PPO

## 2023-09-08 ENCOUNTER — Inpatient Hospital Stay: Payer: Medicare PPO | Attending: Internal Medicine

## 2023-09-08 ENCOUNTER — Encounter: Payer: Self-pay | Admitting: Internal Medicine

## 2023-09-08 ENCOUNTER — Inpatient Hospital Stay (HOSPITAL_BASED_OUTPATIENT_CLINIC_OR_DEPARTMENT_OTHER): Payer: Medicare PPO | Admitting: Internal Medicine

## 2023-09-08 VITALS — BP 130/79 | HR 76 | Temp 97.8°F | Ht 71.0 in | Wt 169.2 lb

## 2023-09-08 DIAGNOSIS — C3412 Malignant neoplasm of upper lobe, left bronchus or lung: Secondary | ICD-10-CM

## 2023-09-08 DIAGNOSIS — C787 Secondary malignant neoplasm of liver and intrahepatic bile duct: Secondary | ICD-10-CM | POA: Insufficient documentation

## 2023-09-08 DIAGNOSIS — C7970 Secondary malignant neoplasm of unspecified adrenal gland: Secondary | ICD-10-CM | POA: Insufficient documentation

## 2023-09-08 DIAGNOSIS — Z79899 Other long term (current) drug therapy: Secondary | ICD-10-CM | POA: Diagnosis not present

## 2023-09-08 DIAGNOSIS — E039 Hypothyroidism, unspecified: Secondary | ICD-10-CM | POA: Insufficient documentation

## 2023-09-08 DIAGNOSIS — F17210 Nicotine dependence, cigarettes, uncomplicated: Secondary | ICD-10-CM | POA: Diagnosis not present

## 2023-09-08 DIAGNOSIS — C7951 Secondary malignant neoplasm of bone: Secondary | ICD-10-CM | POA: Insufficient documentation

## 2023-09-08 DIAGNOSIS — C7931 Secondary malignant neoplasm of brain: Secondary | ICD-10-CM

## 2023-09-08 DIAGNOSIS — Z5112 Encounter for antineoplastic immunotherapy: Secondary | ICD-10-CM | POA: Insufficient documentation

## 2023-09-08 DIAGNOSIS — K5900 Constipation, unspecified: Secondary | ICD-10-CM | POA: Diagnosis not present

## 2023-09-08 LAB — CBC WITH DIFFERENTIAL (CANCER CENTER ONLY)
Abs Immature Granulocytes: 0.03 10*3/uL (ref 0.00–0.07)
Basophils Absolute: 0 10*3/uL (ref 0.0–0.1)
Basophils Relative: 0 %
Eosinophils Absolute: 0.3 10*3/uL (ref 0.0–0.5)
Eosinophils Relative: 4 %
HCT: 36.7 % — ABNORMAL LOW (ref 39.0–52.0)
Hemoglobin: 12.5 g/dL — ABNORMAL LOW (ref 13.0–17.0)
Immature Granulocytes: 0 %
Lymphocytes Relative: 32 %
Lymphs Abs: 2.6 10*3/uL (ref 0.7–4.0)
MCH: 34.2 pg — ABNORMAL HIGH (ref 26.0–34.0)
MCHC: 34.1 g/dL (ref 30.0–36.0)
MCV: 100.3 fL — ABNORMAL HIGH (ref 80.0–100.0)
Monocytes Absolute: 0.8 10*3/uL (ref 0.1–1.0)
Monocytes Relative: 9 %
Neutro Abs: 4.5 10*3/uL (ref 1.7–7.7)
Neutrophils Relative %: 55 %
Platelet Count: 285 10*3/uL (ref 150–400)
RBC: 3.66 MIL/uL — ABNORMAL LOW (ref 4.22–5.81)
RDW: 14.2 % (ref 11.5–15.5)
WBC Count: 8.3 10*3/uL (ref 4.0–10.5)
nRBC: 0 % (ref 0.0–0.2)

## 2023-09-08 LAB — CMP (CANCER CENTER ONLY)
ALT: 12 U/L (ref 0–44)
AST: 17 U/L (ref 15–41)
Albumin: 3.8 g/dL (ref 3.5–5.0)
Alkaline Phosphatase: 75 U/L (ref 38–126)
Anion gap: 12 (ref 5–15)
BUN: 13 mg/dL (ref 8–23)
CO2: 23 mmol/L (ref 22–32)
Calcium: 9.2 mg/dL (ref 8.9–10.3)
Chloride: 101 mmol/L (ref 98–111)
Creatinine: 1.1 mg/dL (ref 0.61–1.24)
GFR, Estimated: >60 mL/min (ref >60)
Glucose, Bld: 100 mg/dL — ABNORMAL HIGH (ref 70–99)
Potassium: 3.9 mmol/L (ref 3.5–5.1)
Sodium: 136 mmol/L (ref 135–145)
Total Bilirubin: 0.6 mg/dL (ref <1.2)
Total Protein: 7.2 g/dL (ref 6.5–8.1)

## 2023-09-08 LAB — LACTATE DEHYDROGENASE: LDH: 179 U/L (ref 98–192)

## 2023-09-08 MED ORDER — SODIUM CHLORIDE 0.9 % IV SOLN
1200.0000 mg | Freq: Once | INTRAVENOUS | Status: AC
  Administered 2023-09-08: 1200 mg via INTRAVENOUS
  Filled 2023-09-08: qty 20

## 2023-09-08 MED ORDER — SODIUM CHLORIDE 0.9 % IV SOLN
Freq: Once | INTRAVENOUS | Status: AC
  Filled 2023-09-08: qty 250

## 2023-09-08 MED ORDER — HEPARIN SOD (PORK) LOCK FLUSH 100 UNIT/ML IV SOLN
500.0000 [IU] | Freq: Once | INTRAVENOUS | Status: AC | PRN
  Administered 2023-09-08: 500 [IU]
  Filled 2023-09-08: qty 5

## 2023-09-08 MED ORDER — OLANZAPINE 5 MG PO TABS: 5.0000 mg | ORAL_TABLET | Freq: Every day | ORAL | 2 refills | Status: DC

## 2023-09-08 NOTE — Assessment & Plan Note (Addendum)
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 Pt finished WBRT in OCT 2024.   #  NOV 5th, 2024- CT CAP-  Similar irregular nodule of the central left upper lobe. Interval enlargement of an adjacent satellite nodule in the left upper lobe as well as new subpleural nodule of the peripheral left upper lobe;  Interval increase in diffuse, matted soft tissue thickening throughout the left hilum and mediastinum. Similar enlargement of pretracheal and right hilar lymph nodes. Newly enlarged right adrenal nodule. Unchanged left adrenal nodule; Numerous newly enlarged retroperitoneal lymph nodes;  Constellation of findings is consistent with primary lung malignancy and worsened pulmonary, nodal, and adrenal metastatic disease. Continue immunotherapy for now.    # proceed with Atezolizumab  Today. Labs-CBC/chemistries were reviewed with the patient; will order CT chest again today, if noted to have progression recommend second line therapy.   # # MRI Brain in AUG 2024- multiple lesions in the brain concerning for metastases. Currently on whole brain RT [10/09-last].   Stable. and plan for imaging 3 months post radiation-; will order MRI Brain today.   # Hypothyroidism: sec to Tecentriq-asymptomatic-Continue/discussed compliance synthroid 150 [increased in OCT] mcg once a day. Monitor closely.   # Pain- Right posterior fifth rib has an SUV max of 3.20. Right ninth posterior rib has an SUV max of 2.49. Right iliac bone lesion has an SUV max of 4; left femur lesion.  Discuss/ordered Zometa. vitamin D levels- pending.  If not better- recommend evaluation with radiation.  Percocet 7.5/325 1 to 2 pills every 12 hours-- stable.   # COPD/cough- continue with inhalers.  Continue Tessalon Perles/ Tussionex prn. S/p  pulmonary Dr.Dgyali re: COPD- stable.   # Smoking: Active smoker; recommend quitting/cutting down- stable.   #  Hoarseness of voice/ choking spells- s/p  ENT evaluation.s/p speech path evaluation- stable.   # Weight loss: Poor appetite- on zyprexa 5 mg at bedtime-stable.   # DISPOSITION: # Tecentriq today; # follow up  in 3  weeks- MD; labs- cbc/cmp;thyroid profile;  LDH;Tecentriq;CT CT chest /MRI Brain prior-Dr.B

## 2023-09-08 NOTE — Progress Notes (Signed)
His sister- [name] passed away thanksgiving day, bone cancer.  No appetite, boost 1/day. Needs refill of olanzapine, pended.  Has had some nausea and vomiting.  Constipation, taking e-lax and miralax.  Wife states having more sob.
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
## 2023-09-16 ENCOUNTER — Inpatient Hospital Stay (HOSPITAL_BASED_OUTPATIENT_CLINIC_OR_DEPARTMENT_OTHER): Payer: Medicare PPO | Admitting: Hospice and Palliative Medicine

## 2023-09-16 DIAGNOSIS — C3412 Malignant neoplasm of upper lobe, left bronchus or lung: Secondary | ICD-10-CM

## 2023-09-16 DIAGNOSIS — Z515 Encounter for palliative care: Secondary | ICD-10-CM | POA: Diagnosis not present

## 2023-09-16 NOTE — Progress Notes (Signed)
Virtual Visit via Telephone Note  I connected with Martin Gomez. on 09/16/23 at  1:40 PM EST by telephone and verified that I am speaking with the correct person using two identifiers.  Location: Patient: Home  Provider: Clinic   I discussed the limitations, risks, security and privacy concerns of performing an evaluation and management service by telephone and the availability of in person appointments. I also discussed with the patient that there may be a patient responsible charge related to this service. The patient expressed understanding and agreed to proceed.   History of Present Illness: Martin Gomez. is a 67 y.o. 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  Patient was referred to palliative care to address goals and manage ongoing symptoms.    Observations/Objective: I called and spoke with patient by phone.  Patient reports she is doing well.  Denies any significant changes or concerns.    Patient reports stable pain.  Says his appetite is poor.  Patient interested in speaking with dietitian.  Patient also endorses occasional depression and anxiety.  He is interested in establishing with [facility].  Assessment and Plan: Stage IV small cell lung cancer -currently on Tecentriq.  Reportedly symptomatically stable.  Neoplasm related pain -stable.  Continue as needed oxycodone.  Depressive symptoms -referral to [facility]  Poor oral intake -referral to dietitian  Follow Up Instructions: Follow-up telephone visit 1 to 2 months   I discussed the assessment and treatment plan with the patient. The patient was provided an opportunity to ask questions and all were answered. The patient agreed with the plan and demonstrated an understanding of the instructions.   The patient was advised to call back or seek an in-person evaluation if the symptoms  worsen or if the condition fails to improve as anticipated.  I provided 5 minutes of non-face-to-face time during this encounter.   Malachy Moan, NP

## 2023-09-21 ENCOUNTER — Ambulatory Visit
Admission: RE | Admit: 2023-09-21 | Discharge: 2023-09-21 | Disposition: A | Discharge status: Home / Self Care | Payer: Medicare PPO | Source: Ambulatory Visit | Attending: Internal Medicine | Admitting: Internal Medicine

## 2023-09-21 ENCOUNTER — Ambulatory Visit
Admission: RE | Admit: 2023-09-21 | Discharge: 2023-09-21 | Disposition: A | Discharge status: Home / Self Care | Payer: Medicare PPO | Source: Ambulatory Visit | Attending: Internal Medicine

## 2023-09-21 DIAGNOSIS — C3412 Malignant neoplasm of upper lobe, left bronchus or lung: Secondary | ICD-10-CM | POA: Diagnosis present

## 2023-09-21 DIAGNOSIS — C7931 Secondary malignant neoplasm of brain: Secondary | ICD-10-CM | POA: Insufficient documentation

## 2023-09-21 MED ORDER — IOHEXOL 350 MG/ML SOLN: 75.0000 mL | Freq: Once | INTRAVENOUS | Status: DC | PRN

## 2023-09-21 MED ORDER — GADOBUTROL 1 MMOL/ML IV SOLN
7.0000 mL | Freq: Once | INTRAVENOUS | Status: AC | PRN
  Administered 2023-09-21: 7 mL via INTRAVENOUS

## 2023-09-21 MED ORDER — IOHEXOL 300 MG/ML  SOLN
75.0000 mL | Freq: Once | INTRAMUSCULAR | Status: AC | PRN
  Administered 2023-09-21: 75 mL via INTRAVENOUS

## 2023-09-22 ENCOUNTER — Ambulatory Visit: Admission: RE | Admit: 2023-09-22 | Payer: Medicare PPO | Source: Ambulatory Visit

## 2023-09-22 ENCOUNTER — Other Ambulatory Visit: Payer: Self-pay | Admitting: *Deleted

## 2023-09-22 MED ORDER — OXYCODONE HCL 15 MG PO TABS: 15.0000 mg | ORAL_TABLET | Freq: Three times a day (TID) | ORAL | 0 refills | Status: DC | PRN

## 2023-09-29 ENCOUNTER — Encounter: Payer: Self-pay | Admitting: Internal Medicine

## 2023-09-29 ENCOUNTER — Ambulatory Visit: Payer: Medicare PPO

## 2023-09-29 ENCOUNTER — Inpatient Hospital Stay: Payer: Medicare PPO

## 2023-09-29 ENCOUNTER — Other Ambulatory Visit: Payer: Medicare PPO

## 2023-09-29 ENCOUNTER — Ambulatory Visit: Payer: Medicare PPO | Admitting: Internal Medicine

## 2023-09-29 ENCOUNTER — Inpatient Hospital Stay: Payer: Medicare PPO | Admitting: Internal Medicine

## 2023-09-29 VITALS — BP 130/90 | HR 54 | Temp 96.0°F

## 2023-09-29 VITALS — BP 107/84 | HR 74 | Temp 96.2°F | Ht 71.0 in | Wt 166.7 lb

## 2023-09-29 DIAGNOSIS — Z5112 Encounter for antineoplastic immunotherapy: Secondary | ICD-10-CM | POA: Diagnosis not present

## 2023-09-29 DIAGNOSIS — C3412 Malignant neoplasm of upper lobe, left bronchus or lung: Secondary | ICD-10-CM

## 2023-09-29 LAB — CMP (CANCER CENTER ONLY)
ALT: 19 U/L (ref 0–44)
AST: 19 U/L (ref 15–41)
Albumin: 3.6 g/dL (ref 3.5–5.0)
Alkaline Phosphatase: 67 U/L (ref 38–126)
Anion gap: 11 (ref 5–15)
BUN: 27 mg/dL — ABNORMAL HIGH (ref 8–23)
CO2: 24 mmol/L (ref 22–32)
Calcium: 9.4 mg/dL (ref 8.9–10.3)
Chloride: 103 mmol/L (ref 98–111)
Creatinine: 1.11 mg/dL (ref 0.61–1.24)
GFR, Estimated: >60 mL/min (ref >60)
Glucose, Bld: 100 mg/dL — ABNORMAL HIGH (ref 70–99)
Potassium: 3.6 mmol/L (ref 3.5–5.1)
Sodium: 138 mmol/L (ref 135–145)
Total Bilirubin: 0.5 mg/dL (ref <1.2)
Total Protein: 7.2 g/dL (ref 6.5–8.1)

## 2023-09-29 LAB — CBC WITH DIFFERENTIAL (CANCER CENTER ONLY)
Abs Immature Granulocytes: 0.07 10*3/uL (ref 0.00–0.07)
Basophils Absolute: 0 10*3/uL (ref 0.0–0.1)
Basophils Relative: 0 %
Eosinophils Absolute: 0.1 10*3/uL (ref 0.0–0.5)
Eosinophils Relative: 1 %
HCT: 40.7 % (ref 39.0–52.0)
Hemoglobin: 13.4 g/dL (ref 13.0–17.0)
Immature Granulocytes: 1 %
Lymphocytes Relative: 29 %
Lymphs Abs: 3.2 10*3/uL (ref 0.7–4.0)
MCH: 33.3 pg (ref 26.0–34.0)
MCHC: 32.9 g/dL (ref 30.0–36.0)
MCV: 101 fL — ABNORMAL HIGH (ref 80.0–100.0)
Monocytes Absolute: 1 10*3/uL (ref 0.1–1.0)
Monocytes Relative: 9 %
Neutro Abs: 6.7 10*3/uL (ref 1.7–7.7)
Neutrophils Relative %: 60 %
Platelet Count: 267 10*3/uL (ref 150–400)
RBC: 4.03 MIL/uL — ABNORMAL LOW (ref 4.22–5.81)
RDW: 13.3 % (ref 11.5–15.5)
WBC Count: 11 10*3/uL — ABNORMAL HIGH (ref 4.0–10.5)
nRBC: 0 % (ref 0.0–0.2)

## 2023-09-29 LAB — LACTATE DEHYDROGENASE: LDH: 183 U/L (ref 98–192)

## 2023-09-29 MED ORDER — SODIUM CHLORIDE 0.9 % IV SOLN
1200.0000 mg | Freq: Once | INTRAVENOUS | Status: AC
  Administered 2023-09-29: 1200 mg via INTRAVENOUS
  Filled 2023-09-29: qty 20

## 2023-09-29 MED ORDER — SODIUM CHLORIDE 0.9 % IV SOLN
Freq: Once | INTRAVENOUS | Status: AC
  Filled 2023-09-29: qty 250

## 2023-09-29 MED ORDER — HEPARIN SOD (PORK) LOCK FLUSH 100 UNIT/ML IV SOLN
500.0000 [IU] | Freq: Once | INTRAVENOUS | Status: AC | PRN
  Administered 2023-09-29: 500 [IU]
  Filled 2023-09-29: qty 5

## 2023-09-29 NOTE — Progress Notes (Signed)
Nutrition Follow-up:  Patient identified on Malnutrition screening report for poor appetite.   Patient with lung cancer, stage IV.  Currently on atezolizumab.  Patient reports planning on changing treatment.  Met with patient during infusion.  Reports that his appetite is up and down.  Drinks 1 boost plus daily.  Usually has cereal or egg or egg sandwich for breakfast.  Skips lunch and dinner is meat and side.  Has been drinking some [employer].  Says that sweeter things taste better to him right now.  Having bowel movement with taking miralax with coffee.      Medications: olanzapine, ondansetron, prochlorperazine  Labs: reviewed  Anthropometrics:   Weight 166 lb 11.2 oz today  177 lb 12.8 oz on 02/02/23 (last seen by RD) 6% weight loss in the last 8 months, concerning  NUTRITION DIAGNOSIS: Inadequate oral intake related to cancer progression and treatment related side effects as evidenced by    INTERVENTION:  Discussed trying boost VHC (530 calories, 22 g protein) Sample given.  Drink 1-2 a day Encouraged sweeter foods as can taste them better    MONITORING, EVALUATION, GOAL: weight trends, intake   NEXT VISIT: Tuesday, Jan 14 during infusion  Shanora Christensen B. Freida Busman, RD, LDN Registered Dietitian [phone] .

## 2023-09-29 NOTE — Patient Instructions (Signed)
[facility] - A DEPT OF [facility]  Discharge Instructions: Thank you for choosing [facility] to provide your oncology and hematology care.  If you have a lab appointment with the Cancer Center, please go directly to the Cancer Center and check in at the registration area.  Wear comfortable clothing and clothing appropriate for easy access to any Portacath or PICC line.   We strive to give you quality time with your provider. You may need to reschedule your appointment if you arrive late (15 or more minutes).  Arriving late affects you and other patients whose appointments are after yours.  Also, if you miss three or more appointments without notifying the office, you may be dismissed from the clinic at the provider's discretion.      For prescription refill requests, have your pharmacy contact our office and allow 72 hours for refills to be completed.    Today you received the following chemotherapy and/or immunotherapy agents Tencentriq       To help prevent nausea and vomiting after your treatment, we encourage you to take your nausea medication as directed.  BELOW ARE SYMPTOMS THAT SHOULD BE REPORTED IMMEDIATELY: *FEVER GREATER THAN 100.4 F (38 C) OR HIGHER *CHILLS OR SWEATING *NAUSEA AND VOMITING THAT IS NOT CONTROLLED WITH YOUR NAUSEA MEDICATION *UNUSUAL SHORTNESS OF BREATH *UNUSUAL BRUISING OR BLEEDING *URINARY PROBLEMS (pain or burning when urinating, or frequent urination) *BOWEL PROBLEMS (unusual diarrhea, constipation, pain near the anus) TENDERNESS IN MOUTH AND THROAT WITH OR WITHOUT PRESENCE OF ULCERS (sore throat, sores in mouth, or a toothache) UNUSUAL RASH, SWELLING OR PAIN  UNUSUAL VAGINAL DISCHARGE OR ITCHING   Items with * indicate a potential emergency and should be followed up as soon as possible or go to the Emergency Department if any problems should occur.  Please show the CHEMOTHERAPY ALERT CARD or IMMUNOTHERAPY  ALERT CARD at check-in to the Emergency Department and triage nurse.  Should you have questions after your visit or need to cancel or reschedule your appointment, please contact [facility] - A DEPT OF [facility]  [phone] and follow the prompts.  Office hours are 8:00 a.m. to 4:30 p.m. Monday - Friday. Please note that voicemails left after 4:00 p.m. may not be returned until the following business day.  We are closed weekends and major holidays. You have access to a nurse at all times for urgent questions. Please call the main number to the clinic [phone] and follow the prompts.  For any non-urgent questions, you may also contact your provider using MyChart. We now offer e-Visits for anyone 33 and older to request care online for non-urgent symptoms. For details visit [email or web address].   Also download the MyChart app! Go to the app store, search "MyChart", open the app, select [facility], and log in with your MyChart username and password.

## 2023-09-29 NOTE — Assessment & Plan Note (Addendum)
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 WBRT in OCT 2024.   #  NOV 5th, 2024- CT CAP-  Similar irregular nodule of the central left upper lobe. Interval enlargement of an adjacent satellite nodule in the left upper lobe as well as new subpleural nodule of the peripheral left upper lobe;  Interval increase in diffuse, matted soft tissue thickening throughout the left hilum and mediastinum. Similar enlargement of pretracheal and right hilar lymph nodes. Newly enlarged right adrenal nodule. Unchanged left adrenal nodule; Numerous newly enlarged retroperitoneal lymph nodes;  Constellation of findings is consistent with primary lung malignancy and worsened pulmonary, nodal, and adrenal metastatic disease. Continue immunotherapy for now.    # proceed with Atezolizumab  Today. Labs-CBC/chemistries were reviewed with the patient; DEC 16th CT chest -pending today.  If progression noted would recommend switching to second line therapy.   # # MRI Brain in AUG 2024- multiple lesions in the brain concerning for metastases. Currently on whole brain RT [10/09-last].   Stable. and plan for imaging 3 months post radiation-; MRI Brain - pending today.   # Hypothyroidism: sec to Tecentriq-asymptomatic-Continue/discussed compliance synthroid 150 [increased in OCT] mcg once a day. Monitor closely.  Await thyroid profile from today.  # Pain- Right posterior fifth rib has an SUV max of 3.20. Right ninth posterior rib has an SUV max of 2.49. Right iliac bone lesion has an SUV max of 4; left femur lesion.  Discuss/ordered Zometa. vitamin D levels- pending.  If not better- recommend evaluation with radiation.  Percocet 7.5/325 1 to 2 pills every 12 hours-- stable.   # COPD/cough- continue with inhalers.  Continue Tessalon Perles/ Tussionex prn. S/p  pulmonary Dr.Dgyali re: COPD- stable.   # Smoking: Active smoker; recommend quitting/cutting down- stable.   #  Hoarseness of voice/ choking spells- s/p  ENT evaluation.s/p speech path evaluation- stable.   # Weight loss: Poor appetite- on zyprexa 5 mg at bedtime-stable.   # DISPOSITION: # Tecentriq today; # follow up  in 3  weeks- MD; labs- cbc/cmp;thyroid profile;  LDH;Tecentriq;-Dr.B

## 2023-09-29 NOTE — Progress Notes (Unsigned)
Appetite 50% normal, 1 boost per day. Having some nausea.
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
## 2023-10-01 LAB — THYROID PANEL WITH TSH
Free Thyroxine Index: 3.1 (ref 1.2–4.9)
T3 Uptake Ratio: 31 % (ref 24–39)
T4, Total: 9.9 ug/dL (ref 4.5–12.0)
TSH: 0.382 u[IU]/mL — ABNORMAL LOW (ref 0.450–4.500)

## 2023-10-05 ENCOUNTER — Other Ambulatory Visit: Payer: Self-pay | Admitting: Nurse Practitioner

## 2023-10-05 ENCOUNTER — Encounter: Payer: Self-pay | Admitting: Internal Medicine

## 2023-10-05 ENCOUNTER — Telehealth: Payer: Self-pay | Admitting: Nurse Practitioner

## 2023-10-05 DIAGNOSIS — C7931 Secondary malignant neoplasm of brain: Secondary | ICD-10-CM

## 2023-10-05 DIAGNOSIS — C3412 Malignant neoplasm of upper lobe, left bronchus or lung: Secondary | ICD-10-CM

## 2023-10-05 NOTE — Telephone Encounter (Signed)
Called patient to review results of CT scan. No answer. Left vm.

## 2023-10-05 NOTE — Progress Notes (Signed)
Spoke to [name]. He has reviewed results of recent ct with patient which was felt to reflect progressive disease. He recommends second line lurbinectedin. Treatment plan entered on his behalf. Will plan for follow up with initiation of treatment on 1/14 as previously planned.

## 2023-10-05 NOTE — Progress Notes (Signed)
DISCONTINUE ON PATHWAY REGIMEN - Small Cell Lung     Cycles 1 through 4, every 21 days:     Atezolizumab      Carboplatin      Etoposide    Cycles 5 and beyond, every 21 days:     Atezolizumab   **Always confirm dose/schedule in your pharmacy ordering system**  PRIOR TREATMENT: [id number]: Atezolizumab 1,200 mg D1 + Carboplatin AUC=5 D1 + Etoposide 100 mg/m2 D1-3 q21 Days x 4 Cycles, Followed by Atezolizumab 1,200 mg q21 Days Until Progression or Unacceptable Toxicity  START ON PATHWAY REGIMEN - Small Cell Lung     A cycle is every 21 days:     Lurbinectedin   **Always confirm dose/schedule in your pharmacy ordering system**  Patient Characteristics: Relapsed or Progressive Disease, Second Line, Relapse > 6 Months Therapeutic Status: Relapsed or Progressive Disease Line of Therapy: Second Line Time to Relapse: Relapse > 6 Months Intent of Therapy: Non-Curative / Palliative Intent, Discussed with Patient

## 2023-10-06 ENCOUNTER — Encounter: Payer: Self-pay | Admitting: Internal Medicine

## 2023-10-08 ENCOUNTER — Encounter: Payer: Self-pay | Admitting: Internal Medicine

## 2023-10-08 NOTE — Progress Notes (Signed)
 Pharmacist Chemotherapy Monitoring - Initial Assessment    Anticipated start date: 10/20/23   The following has been reviewed per standard work regarding the patient's treatment regimen: The patient's diagnosis, treatment plan and drug doses, and organ/hematologic function Lab orders and baseline tests specific to treatment regimen  The treatment plan start date, drug sequencing, and pre-medications Prior authorization status  Patient's documented medication list, including drug-drug interaction screen and prescriptions for anti-emetics and supportive care specific to the treatment regimen The drug concentrations, fluid compatibility, administration routes, and timing of the medications to be used The patient's access for treatment and lifetime cumulative dose history, if applicable  The patient's medication allergies and previous infusion related reactions, if applicable   Changes made to treatment plan:  pre-medications IV push dexamethasone   Follow up needed:  Pending authorization for treatment    Maudie FORBES Andreas, PharmD, BCPS Clinical Pharmacist   10/08/2023  2:48 PM

## 2023-10-12 ENCOUNTER — Telehealth: Payer: Self-pay | Admitting: Internal Medicine

## 2023-10-12 ENCOUNTER — Telehealth: Payer: Self-pay | Admitting: *Deleted

## 2023-10-12 ENCOUNTER — Inpatient Hospital Stay: Payer: Medicare PPO | Attending: Internal Medicine | Admitting: Hospice and Palliative Medicine

## 2023-10-12 ENCOUNTER — Encounter: Payer: Self-pay | Admitting: Internal Medicine

## 2023-10-12 DIAGNOSIS — Z5112 Encounter for antineoplastic immunotherapy: Secondary | ICD-10-CM | POA: Insufficient documentation

## 2023-10-12 DIAGNOSIS — Z923 Personal history of irradiation: Secondary | ICD-10-CM | POA: Insufficient documentation

## 2023-10-12 DIAGNOSIS — C7951 Secondary malignant neoplasm of bone: Secondary | ICD-10-CM | POA: Insufficient documentation

## 2023-10-12 DIAGNOSIS — C3412 Malignant neoplasm of upper lobe, left bronchus or lung: Secondary | ICD-10-CM | POA: Diagnosis not present

## 2023-10-12 DIAGNOSIS — C787 Secondary malignant neoplasm of liver and intrahepatic bile duct: Secondary | ICD-10-CM | POA: Insufficient documentation

## 2023-10-12 DIAGNOSIS — G893 Neoplasm related pain (acute) (chronic): Secondary | ICD-10-CM | POA: Diagnosis not present

## 2023-10-12 DIAGNOSIS — F17210 Nicotine dependence, cigarettes, uncomplicated: Secondary | ICD-10-CM | POA: Insufficient documentation

## 2023-10-12 DIAGNOSIS — C7971 Secondary malignant neoplasm of right adrenal gland: Secondary | ICD-10-CM | POA: Insufficient documentation

## 2023-10-12 DIAGNOSIS — C7802 Secondary malignant neoplasm of left lung: Secondary | ICD-10-CM | POA: Insufficient documentation

## 2023-10-12 DIAGNOSIS — I10 Essential (primary) hypertension: Secondary | ICD-10-CM | POA: Insufficient documentation

## 2023-10-12 DIAGNOSIS — Z79899 Other long term (current) drug therapy: Secondary | ICD-10-CM | POA: Insufficient documentation

## 2023-10-12 DIAGNOSIS — E039 Hypothyroidism, unspecified: Secondary | ICD-10-CM | POA: Insufficient documentation

## 2023-10-12 DIAGNOSIS — C7931 Secondary malignant neoplasm of brain: Secondary | ICD-10-CM | POA: Insufficient documentation

## 2023-10-12 DIAGNOSIS — Z515 Encounter for palliative care: Secondary | ICD-10-CM | POA: Diagnosis not present

## 2023-10-12 MED ORDER — NALOXONE HCL 4 MG/0.1ML NA LIQD: NASAL | 0 refills | Status: DC

## 2023-10-12 MED ORDER — MORPHINE SULFATE ER 15 MG PO TBCR: 15.0000 mg | EXTENDED_RELEASE_TABLET | Freq: Two times a day (BID) | ORAL | 0 refills | Status: DC

## 2023-10-12 MED ORDER — OXYCODONE HCL 15 MG PO TABS: 15.0000 mg | ORAL_TABLET | Freq: Four times a day (QID) | ORAL | 0 refills | Status: DC | PRN

## 2023-10-12 NOTE — Telephone Encounter (Signed)
 Pharmacy requested this patient appointments be changed.   Called [patient and changed appointments he requested reminder be mailed- AVS mailed

## 2023-10-12 NOTE — Telephone Encounter (Signed)
 Received call from pt's wife that is having increased pain in his right side for the past 5 days. Pain not relieved with current pain medication and pt's wife states that he only gets relief when taking 2 tablets of his oxycodone  (30mg ) every 6 hours. Per Sidra, will need to schedule telephone visit this afternoon to further discuss pain management. Pt scheduled for tele visit at 3pm. Pt's wife made aware. Nothing further needed at this time.

## 2023-10-12 NOTE — Progress Notes (Signed)
 Virtual Visit via Telephone Note  I connected with Martin Gomez. on 10/12/23 at  3:00 PM EST by telephone and verified that I am speaking with the correct person using two identifiers.  Location: Patient: Home  Provider: Clinic   I discussed the limitations, risks, security and privacy concerns of performing an evaluation and management service by telephone and the availability of in person appointments. I also discussed with the patient that there may be a patient responsible charge related to this service. The patient expressed understanding and agreed to proceed.   History of Present Illness: Martin Gomez. is a 68 y.o. 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  Patient was referred to palliative care to address goals and manage ongoing symptoms.    Observations/Objective: I called and spoke with patient by phone.  Patient endorses worse pain, primarily on the right side of chest/flank.  Denies fever or chills.  No urinary symptoms.  He has been taking the oxycodone  more liberally, generally 3-4 times daily.  He has on occasion also taking 2 tablets.  Patient has also been adding acetaminophen , which he says has helped.  He denies any adverse effects from pain medications.  Patient requests a longer acting pain medication.  CT chest on 09/21/2023 showed disease progression with new and enlarging left-sided lung nodules and significant progression of left mediastinal/hilar adenopathy and a large right adrenal metastasis and developing cervical/upper abdominal adenopathy.  Patient also has right rib osseous metastasis similar to previous studies.  Suspect that worse pain is secondary to overall disease progression as evidenced by recent CT.  Discussed short acting versus long-acting opioids.  Will start patient on MS Contin .  Patient will continue using his  oxycodone  as needed for breakthrough pain.  Will refill oxycodone  as well.  Discussed bowel regimen to prevent opioid-induced constipation.  Assessment and Plan: Stage IV small cell lung cancer -currently on Tecentriq .  Reportedly symptomatically stable.  Neoplasm related pain -refill oxycodone .  Start MS Contin  15 mg every 12 hours #30. PDMP reviewed. Naloxone  rx.   Follow Up Instructions: Follow-up telephone visit 2 weeks   I discussed the assessment and treatment plan with the patient. The patient was provided an opportunity to ask questions and all were answered. The patient agreed with the plan and demonstrated an understanding of the instructions.   The patient was advised to call back or seek an in-person evaluation if the symptoms worsen or if the condition fails to improve as anticipated.  I provided 15 minutes of non-face-to-face time during this encounter.   FONDA JONELLE MOWER, NP

## 2023-10-13 ENCOUNTER — Other Ambulatory Visit: Payer: Self-pay | Admitting: *Deleted

## 2023-10-13 ENCOUNTER — Telehealth: Payer: Self-pay | Admitting: *Deleted

## 2023-10-13 MED ORDER — XTAMPZA ER 18 MG PO C12A: 18.0000 mg | EXTENDED_RELEASE_CAPSULE | Freq: Two times a day (BID) | ORAL | 0 refills | Status: DC

## 2023-10-13 NOTE — Telephone Encounter (Signed)
 Per pt's wife, MS contin  not available to pick up at pharmacy and nearby pharmacies do not have it in stock either. Confirmed with pharmacist that MS Contin  is on back order. Per Sidra, will send in prescription for xtampza  ER 18mg  capsules ever 12 hours #60. PA submitted to [facility] (Ref# [id number]). Updated pt's wife and informed will call back once decision from insurance received. Pt's wife verbalized understanding.

## 2023-10-14 ENCOUNTER — Other Ambulatory Visit: Payer: Self-pay | Admitting: *Deleted

## 2023-10-14 ENCOUNTER — Encounter: Payer: Self-pay | Admitting: Internal Medicine

## 2023-10-14 DIAGNOSIS — C3412 Malignant neoplasm of upper lobe, left bronchus or lung: Secondary | ICD-10-CM

## 2023-10-14 MED ORDER — FENTANYL 25 MCG/HR TD PT72: 1.0000 | MEDICATED_PATCH | TRANSDERMAL | 0 refills | Status: DC

## 2023-10-14 NOTE — Telephone Encounter (Signed)
 Xtampza  ER 18mg  capsules denied by [facility]. Josh made aware and recommended to send in fentanyl  patch 25mcg q72 hours. Spoke with Andrea at [facility] to d/c previous prescriptions for MS contin  and Xtampza  ER. Per Andrea, fentanyl  patch 25mcg is covered under his insurance and copay is about $4. Called and spoke with pt's wife regarding medication change. Reviewed how to apply fentanyl  patch and informed that the prescription is being processed at [facility] which should be ready this afternoon. Instructed to call back if has any further questions or needs. Pt's wife verbalized understanding.

## 2023-10-19 ENCOUNTER — Encounter: Payer: Self-pay | Admitting: Internal Medicine

## 2023-10-19 ENCOUNTER — Telehealth: Payer: Self-pay | Admitting: *Deleted

## 2023-10-19 NOTE — Telephone Encounter (Signed)
 Pt continues to have uncontrolled pain while wearing 25mcg fentanyl  patch. Per Josh, may increase patch to 50mcg. Pt's wife made aware that pt needs to start wearing 2-25mcg patches for total dose of 50mcg to see if that helps manage his pain better. Informed that will follow up on his pain at his next appt with Dr. KATHEE on Wed 1/15. Instructed to call back with any further questions prior to appt. Pt's wife verbalized understanding.

## 2023-10-20 ENCOUNTER — Other Ambulatory Visit: Payer: Medicare PPO

## 2023-10-20 ENCOUNTER — Ambulatory Visit: Payer: Medicare PPO | Admitting: Internal Medicine

## 2023-10-20 ENCOUNTER — Ambulatory Visit: Payer: Medicare PPO

## 2023-10-20 ENCOUNTER — Encounter: Payer: Self-pay | Admitting: Internal Medicine

## 2023-10-21 ENCOUNTER — Other Ambulatory Visit: Payer: Medicare PPO

## 2023-10-21 ENCOUNTER — Inpatient Hospital Stay (HOSPITAL_BASED_OUTPATIENT_CLINIC_OR_DEPARTMENT_OTHER): Payer: Medicare PPO | Admitting: Internal Medicine

## 2023-10-21 ENCOUNTER — Inpatient Hospital Stay: Payer: Medicare PPO | Admitting: Internal Medicine

## 2023-10-21 ENCOUNTER — Inpatient Hospital Stay: Payer: Medicare PPO

## 2023-10-21 ENCOUNTER — Other Ambulatory Visit: Payer: Self-pay | Admitting: Internal Medicine

## 2023-10-21 ENCOUNTER — Encounter: Payer: Self-pay | Admitting: Internal Medicine

## 2023-10-21 VITALS — BP 122/77 | HR 90 | Resp 16

## 2023-10-21 VITALS — BP 94/51 | HR 96 | Temp 96.9°F | Resp 17 | Wt 158.0 lb

## 2023-10-21 DIAGNOSIS — E039 Hypothyroidism, unspecified: Secondary | ICD-10-CM | POA: Diagnosis not present

## 2023-10-21 DIAGNOSIS — I10 Essential (primary) hypertension: Secondary | ICD-10-CM | POA: Diagnosis not present

## 2023-10-21 DIAGNOSIS — C3412 Malignant neoplasm of upper lobe, left bronchus or lung: Secondary | ICD-10-CM

## 2023-10-21 DIAGNOSIS — C7931 Secondary malignant neoplasm of brain: Secondary | ICD-10-CM | POA: Diagnosis not present

## 2023-10-21 DIAGNOSIS — C7802 Secondary malignant neoplasm of left lung: Secondary | ICD-10-CM | POA: Diagnosis not present

## 2023-10-21 DIAGNOSIS — C787 Secondary malignant neoplasm of liver and intrahepatic bile duct: Secondary | ICD-10-CM | POA: Diagnosis not present

## 2023-10-21 DIAGNOSIS — Z5112 Encounter for antineoplastic immunotherapy: Secondary | ICD-10-CM | POA: Diagnosis present

## 2023-10-21 DIAGNOSIS — Z923 Personal history of irradiation: Secondary | ICD-10-CM | POA: Diagnosis not present

## 2023-10-21 DIAGNOSIS — Z79899 Other long term (current) drug therapy: Secondary | ICD-10-CM | POA: Diagnosis not present

## 2023-10-21 DIAGNOSIS — C7971 Secondary malignant neoplasm of right adrenal gland: Secondary | ICD-10-CM | POA: Diagnosis not present

## 2023-10-21 DIAGNOSIS — C7951 Secondary malignant neoplasm of bone: Secondary | ICD-10-CM | POA: Diagnosis not present

## 2023-10-21 DIAGNOSIS — F17210 Nicotine dependence, cigarettes, uncomplicated: Secondary | ICD-10-CM | POA: Diagnosis not present

## 2023-10-21 LAB — CBC WITH DIFFERENTIAL (CANCER CENTER ONLY)
Abs Immature Granulocytes: 0.05 10*3/uL (ref 0.00–0.07)
Basophils Absolute: 0 10*3/uL (ref 0.0–0.1)
Basophils Relative: 0 %
Eosinophils Absolute: 0.1 10*3/uL (ref 0.0–0.5)
Eosinophils Relative: 1 %
HCT: 40.6 % (ref 39.0–52.0)
Hemoglobin: 13.5 g/dL (ref 13.0–17.0)
Immature Granulocytes: 1 %
Lymphocytes Relative: 25 %
Lymphs Abs: 2.2 10*3/uL (ref 0.7–4.0)
MCH: 33.8 pg (ref 26.0–34.0)
MCHC: 33.3 g/dL (ref 30.0–36.0)
MCV: 101.8 fL — ABNORMAL HIGH (ref 80.0–100.0)
Monocytes Absolute: 0.8 10*3/uL (ref 0.1–1.0)
Monocytes Relative: 9 %
Neutro Abs: 5.8 10*3/uL (ref 1.7–7.7)
Neutrophils Relative %: 64 %
Platelet Count: 207 10*3/uL (ref 150–400)
RBC: 3.99 MIL/uL — ABNORMAL LOW (ref 4.22–5.81)
RDW: 12.9 % (ref 11.5–15.5)
WBC Count: 9 10*3/uL (ref 4.0–10.5)
nRBC: 0 % (ref 0.0–0.2)

## 2023-10-21 LAB — CMP (CANCER CENTER ONLY)
ALT: 40 U/L (ref 0–44)
AST: 35 U/L (ref 15–41)
Albumin: 3.2 g/dL — ABNORMAL LOW (ref 3.5–5.0)
Alkaline Phosphatase: 224 U/L — ABNORMAL HIGH (ref 38–126)
Anion gap: 10 (ref 5–15)
BUN: 18 mg/dL (ref 8–23)
CO2: 24 mmol/L (ref 22–32)
Calcium: 9 mg/dL (ref 8.9–10.3)
Chloride: 103 mmol/L (ref 98–111)
Creatinine: 1.01 mg/dL (ref 0.61–1.24)
GFR, Estimated: >60 mL/min (ref >60)
Glucose, Bld: 103 mg/dL — ABNORMAL HIGH (ref 70–99)
Potassium: 3.9 mmol/L (ref 3.5–5.1)
Sodium: 137 mmol/L (ref 135–145)
Total Bilirubin: 0.5 mg/dL (ref 0.0–1.2)
Total Protein: 6.9 g/dL (ref 6.5–8.1)

## 2023-10-21 LAB — CK: Total CK: 55 U/L (ref 49–397)

## 2023-10-21 LAB — LACTATE DEHYDROGENASE: LDH: 350 U/L — ABNORMAL HIGH (ref 98–192)

## 2023-10-21 MED ORDER — FENTANYL 50 MCG/HR TD PT72: 1.0000 | MEDICATED_PATCH | TRANSDERMAL | 0 refills | Status: DC

## 2023-10-21 MED ORDER — DEXAMETHASONE SODIUM PHOSPHATE 10 MG/ML IJ SOLN
10.0000 mg | Freq: Once | INTRAMUSCULAR | Status: AC
  Administered 2023-10-21: 10 mg via INTRAVENOUS
  Filled 2023-10-21: qty 1

## 2023-10-21 MED ORDER — HEPARIN SOD (PORK) LOCK FLUSH 100 UNIT/ML IV SOLN
500.0000 [IU] | Freq: Once | INTRAVENOUS | Status: AC | PRN
  Administered 2023-10-21: 500 [IU]
  Filled 2023-10-21: qty 5

## 2023-10-21 MED ORDER — PALONOSETRON HCL INJECTION 0.25 MG/5ML
0.2500 mg | Freq: Once | INTRAVENOUS | Status: AC
  Administered 2023-10-21: 0.25 mg via INTRAVENOUS
  Filled 2023-10-21: qty 5

## 2023-10-21 MED ORDER — SODIUM CHLORIDE 0.9 % IV SOLN
INTRAVENOUS | Status: DC
  Filled 2023-10-21: qty 250

## 2023-10-21 MED ORDER — SODIUM CHLORIDE 0.9 % IV SOLN
3.2000 mg/m2 | Freq: Once | INTRAVENOUS | Status: AC
  Administered 2023-10-21: 6.25 mg via INTRAVENOUS
  Filled 2023-10-21: qty 12.5

## 2023-10-21 NOTE — Progress Notes (Signed)
 Pt taking one 15 mg oxycodone  every 5 hours instead of every 6 hours due to more pain. Pain is on his R side. Heating pad also helps.Poor appetite, drinking boost 1-2 bottles per day. Down 8 lbs today. Having constipation, instructed to increase miralax dosage amount to 2 caps. Weak; occ dizziness. Near misses for falling.

## 2023-10-21 NOTE — Assessment & Plan Note (Addendum)
#   STAGE IV- Left upper lobe lung cancer liver biopsy [FEB, 2024] -small cell lung cancer-most recently just on Atezolizumab . WBRT in OCT 2024.   # DEC 16th, 2024- Disease progression, as evidenced by new and enlarging left sided lung nodules, significant progression of left mediastinal/hilar adenopathy, enlarged right adrenal metastasis, and developing lower cervical/upper abdominal adenopathy. 5th posterolateral right rib osseous metastasis, relatively similar.  DISCONTINUE Atezolizumab  given the progressive disease.  #  Recommend starting second line chemotherapy with lubrinectidin every 3 weeks. I reviewed at length the individual components with chemotherapy; and the schedule in detail.  I also discussed the potential side effects including but not limited to-increasing fatigue, nausea vomiting, diarrhea, hair loss, sores in the mouth, increase risk of infection and also neuropathy.  Also reviewed the multiple strategies to avoid/mitigate similar side effects including preemptive medications-for nausea vomiting.  Understand treatments are palliative not curative.  Response rates in the order of 30%.   # # MRI Brain in AUG 2024- s/p whole brain RT [10/09-last]. MRI Brain -dec 2024-significant improvement no evidence of any progression. Stable.   #  Pain- Right posterior fifth rib has an SUV max of 3.20. Right ninth posterior rib has an SUV max of 2.49. Right iliac bone lesion has an SUV max of 4; left femur lesion.  On fenatnl patch 50 mcg; on oxycodone  15 mg q 6 hours.  Follow-up radiation as pain appears to be mostly from his increasing adrenal lesion.  # Hypothyroidism: sec to Tecentriq - on synthroid  150 [increased in OCT] mcg once a day.  Await thyroid  profile from today.  # HTN-given the borderline hypotension recommend HOLD Lisinopril-until further directions.  Recheck at next visit.  # COPD/cough- continue with inhalers.  Continue Tessalon  Perles/ Tussionex prn. S/p  pulmonary Dr.Dgyali re:  COPD- stable.   # Smoking: Active smoker; recommend quitting/cutting down- stable.   # Hoarseness of voice/ choking spells- s/p  ENT evaluation.s/p speech path evaluation- stable.   # Weight loss: Poor appetite- on zyprexa  5 mg at bedtime-stable.   # DISPOSITION: # chemo today; # Follow up in APP next week-Tuesday/Thursday- labs- cbc/bmp # follow up  in 3  weeks- MD; labs- cbc/cmp;thyroid  profile;  LDH; chemo-Dr.B  # I reviewed the blood work- with the patient in detail; also reviewed the imaging independently [as summarized above]; and with the patient in detail.    # 40 minutes face-to-face with the patient discussing the above plan of care; more than 50% of time spent on prognosis/ natural history; counseling and coordination.
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
## 2023-10-21 NOTE — Patient Instructions (Signed)
 [facility] - A DEPT OF [facility]  Discharge Instructions: Thank you for choosing [facility] to provide your oncology and hematology care.  If you have a lab appointment with the Cancer Center, please go directly to the Cancer Center and check in at the registration area.  Wear comfortable clothing and clothing appropriate for easy access to any Portacath or PICC line.   We strive to give you quality time with your provider. You may need to reschedule your appointment if you arrive late (15 or more minutes).  Arriving late affects you and other patients whose appointments are after yours.  Also, if you miss three or more appointments without notifying the office, you may be dismissed from the clinic at the provider's discretion.      For prescription refill requests, have your pharmacy contact our office and allow 72 hours for refills to be completed.    Today you received the following chemotherapy and/or immunotherapy agents zepzelca     To help prevent nausea and vomiting after your treatment, we encourage you to take your nausea medication as directed.  BELOW ARE SYMPTOMS THAT SHOULD BE REPORTED IMMEDIATELY: *FEVER GREATER THAN 100.4 F (38 C) OR HIGHER *CHILLS OR SWEATING *NAUSEA AND VOMITING THAT IS NOT CONTROLLED WITH YOUR NAUSEA MEDICATION *UNUSUAL SHORTNESS OF BREATH *UNUSUAL BRUISING OR BLEEDING *URINARY PROBLEMS (pain or burning when urinating, or frequent urination) *BOWEL PROBLEMS (unusual diarrhea, constipation, pain near the anus) TENDERNESS IN MOUTH AND THROAT WITH OR WITHOUT PRESENCE OF ULCERS (sore throat, sores in mouth, or a toothache) UNUSUAL RASH, SWELLING OR PAIN  UNUSUAL VAGINAL DISCHARGE OR ITCHING   Items with * indicate a potential emergency and should be followed up as soon as possible or go to the Emergency Department if any problems should occur.  Please show the CHEMOTHERAPY ALERT CARD or IMMUNOTHERAPY ALERT  CARD at check-in to the Emergency Department and triage nurse.  Should you have questions after your visit or need to cancel or reschedule your appointment, please contact [facility] - A DEPT OF [facility]  [phone] and follow the prompts.  Office hours are 8:00 a.m. to 4:30 p.m. Monday - Friday. Please note that voicemails left after 4:00 p.m. may not be returned until the following business day.  We are closed weekends and major holidays. You have access to a nurse at all times for urgent questions. Please call the main number to the clinic [phone] and follow the prompts.  For any non-urgent questions, you may also contact your provider using MyChart. We now offer e-Visits for anyone 86 and older to request care online for non-urgent symptoms. For details visit [email or web address].   Also download the MyChart app! Go to the app store, search "MyChart", open the app, select [place], and log in with your MyChart username and password.  Lurbinectedin  Injection What is this medication? LURBINECTEDIN  (LOOR bin EK te din) treats lung cancer. It works by slowing down the growth of cancer cells. This medicine may be used for other purposes; ask your health care provider or pharmacist if you have questions. COMMON BRAND NAME(S): ZEPZELCA  What should I tell my care team before I take this medication? They need to know if you have any of these conditions: Liver disease Low blood cell levels, such as low white cells, platelets, red blood cells An unusual or allergic reaction to lurbinectedin , other medications, foods, dyes, or preservatives If  you or your partner are pregnant or trying to get pregnant Breastfeeding How should I use this medication? This medication is injected into a vein. It is given by your care team in a hospital or clinic setting. Talk to your care team about the use of this medication in children. Special care may be  needed. Overdosage: If you think you have taken too much of this medicine contact a poison control center or emergency room at once. NOTE: This medicine is only for you. Do not share this medicine with others. What if I miss a dose? Keep appointments for follow-up doses. It is important not to miss your dose. Call your care team if you are unable to keep an appointment. What may interact with this medication? Grapefruit juice or [place] oranges Other medications may affect the way this medication works. Talk with your care team about all of the medications you take. They may suggest changes to your treatment plan to lower the risk of side effects and to make sure your medications work as intended. This list may not describe all possible interactions. Give your health care provider a list of all the medicines, herbs, non-prescription drugs, or dietary supplements you use. Also tell them if you smoke, drink alcohol, or use illegal drugs. Some items may interact with your medicine. What should I watch for while using this medication? Your condition will be monitored carefully while you are receiving this medication. This medication may make you feel generally unwell. This is not uncommon as chemotherapy can affect healthy cells as well as cancer cells. Report any side effects. Continue your course of treatment even though you feel ill unless your care team tells you to stop. This medication may increase your risk of getting an infection. Call your care team for advice if you get a fever, chills, sore throat, or other symptoms of a cold or flu. Do not treat yourself. Try to avoid being around people who are sick. Avoid taking medications that contain aspirin, acetaminophen , ibuprofen, naproxen, or ketoprofen unless instructed by your care team. These medications may hide a fever. Be careful brushing or flossing your teeth or using a toothpick because you may get an infection or bleed more easily. If you  have any dental work done, tell your dentist you are receiving this medication. Talk to your care team if you may be pregnant. Serious birth defects can occur if you take this medication during pregnancy and for 6 months after the last dose. You will need a negative pregnancy test before starting this medication. Contraception is recommended while taking this medication and for 6 months after the last dose. If your partner can get pregnant, use a condom during sex while taking this medication and for 4 months after the last dose. Do not breastfeed while taking this medication and for 2 weeks after the last dose. What side effects may I notice from receiving this medication? Side effects that you should report to your care team as soon as possible: Allergic reactions--skin rash, itching, hives, swelling of the face, lips, tongue, or throat Infection--fever, chills, cough, sore throat, wounds that don't heal, pain or trouble when passing urine, general feeling of discomfort or being unwell Liver injury--right upper belly pain, loss of appetite, nausea, light-colored stool, dark yellow or brown urine, yellowing skin or eyes, unusual weakness or fatigue Low red blood cell level--unusual weakness or fatigue, dizziness, headache, trouble breathing Muscle injury--unusual weakness or fatigue, muscle pain, dark yellow or brown urine, decrease in  amount of urine Painful swelling, warmth, or redness of the skin, blisters or sores at the infusion site Unusual bruising or bleeding Side effects that usually do not require medical attention (report these to your care team if they continue or are bothersome): Constipation Cough Diarrhea Fatigue Loss of appetite Nausea This list may not describe all possible side effects. Call your doctor for medical advice about side effects. You may report side effects to FDA at 1-800-FDA-1088. Where should I keep my medication? This medication is given in a hospital or  clinic. It will not be stored at home. NOTE: This sheet is a summary. It may not cover all possible information. If you have questions about this medicine, talk to your doctor, pharmacist, or health care provider.  2024 Elsevier/Gold Standard (2022-05-06 00:00:00)

## 2023-10-21 NOTE — Progress Notes (Signed)
 Nutrition Follow-up:  Patient with lung cancer, stage IV.  Treatment changing to lurbinectedin  due to progression.    Met with patient during infusion.  Reports poor po intake.  Drinking boost plus 1-2 times a day.  Usually has a brunch (cereal or egg sandwich).  Yesterday ate a late lunch and snacked for dinner.  Having issues with constipation.  Planning increasing miralax dose today.  Can't afford boost VHC shakes and not available at [employer] where he shops and uses wife's card (through insurance) to buy shakes.    Medications: reviewed  Labs: reviewed  Anthropometrics:   Weight 158 lb today, decreased 166 lb 11.2 oz on 12/24 177 lb 12.8 oz on 02/02/23 (last seen by RD)  11% weight loss in the last 3 weeks, significant   NUTRITION DIAGNOSIS: Inadequate oral intake continues   INTERVENTION:  Continue boost plus shakes as many as possible Reviewed ways to add calories and protein in diet Encouraged small frequent meals/mini snacks Encouraged bowel regimen to prevent constipation.     MONITORING, EVALUATION, GOAL: weight trends, intake   NEXT VISIT: Tuesday, Feb 4th during infusion  Jaz Mallick B. Leighton Punches, RD, LDN Registered Dietitian [phone]

## 2023-10-21 NOTE — Patient Instructions (Signed)
#   HOLD Lisinopril-until further directions.

## 2023-10-22 LAB — THYROID PANEL WITH TSH
Free Thyroxine Index: 3.8 (ref 1.2–4.9)
T3 Uptake Ratio: 35 % (ref 24–39)
T4, Total: 10.9 ug/dL (ref 4.5–12.0)
TSH: 0.198 u[IU]/mL — ABNORMAL LOW (ref 0.450–4.500)

## 2023-10-27 ENCOUNTER — Encounter: Payer: Self-pay | Admitting: Nurse Practitioner

## 2023-10-27 ENCOUNTER — Inpatient Hospital Stay (HOSPITAL_BASED_OUTPATIENT_CLINIC_OR_DEPARTMENT_OTHER): Payer: Medicare PPO | Admitting: Nurse Practitioner

## 2023-10-27 ENCOUNTER — Inpatient Hospital Stay (HOSPITAL_BASED_OUTPATIENT_CLINIC_OR_DEPARTMENT_OTHER): Payer: Medicare PPO | Admitting: Hospice and Palliative Medicine

## 2023-10-27 ENCOUNTER — Inpatient Hospital Stay: Payer: Medicare PPO

## 2023-10-27 VITALS — BP 126/87 | HR 100 | Temp 97.2°F | Wt 144.0 lb

## 2023-10-27 DIAGNOSIS — C3412 Malignant neoplasm of upper lobe, left bronchus or lung: Secondary | ICD-10-CM

## 2023-10-27 DIAGNOSIS — Z515 Encounter for palliative care: Secondary | ICD-10-CM | POA: Diagnosis not present

## 2023-10-27 DIAGNOSIS — Z5112 Encounter for antineoplastic immunotherapy: Secondary | ICD-10-CM | POA: Diagnosis not present

## 2023-10-27 DIAGNOSIS — Z09 Encounter for follow-up examination after completed treatment for conditions other than malignant neoplasm: Secondary | ICD-10-CM | POA: Diagnosis not present

## 2023-10-27 LAB — BASIC METABOLIC PANEL - CANCER CENTER ONLY
Anion gap: 10 (ref 5–15)
BUN: 25 mg/dL — ABNORMAL HIGH (ref 8–23)
CO2: 23 mmol/L (ref 22–32)
Calcium: 8.7 mg/dL — ABNORMAL LOW (ref 8.9–10.3)
Chloride: 104 mmol/L (ref 98–111)
Creatinine: 0.96 mg/dL (ref 0.61–1.24)
GFR, Estimated: >60 mL/min (ref >60)
Glucose, Bld: 124 mg/dL — ABNORMAL HIGH (ref 70–99)
Potassium: 4.1 mmol/L (ref 3.5–5.1)
Sodium: 137 mmol/L (ref 135–145)

## 2023-10-27 LAB — CBC WITH DIFFERENTIAL (CANCER CENTER ONLY)
Abs Immature Granulocytes: 0.06 10*3/uL (ref 0.00–0.07)
Basophils Absolute: 0 10*3/uL (ref 0.0–0.1)
Basophils Relative: 0 %
Eosinophils Absolute: 0 10*3/uL (ref 0.0–0.5)
Eosinophils Relative: 1 %
HCT: 40.4 % (ref 39.0–52.0)
Hemoglobin: 13.4 g/dL (ref 13.0–17.0)
Immature Granulocytes: 1 %
Lymphocytes Relative: 40 %
Lymphs Abs: 2.7 10*3/uL (ref 0.7–4.0)
MCH: 33.3 pg (ref 26.0–34.0)
MCHC: 33.2 g/dL (ref 30.0–36.0)
MCV: 100.5 fL — ABNORMAL HIGH (ref 80.0–100.0)
Monocytes Absolute: 0.1 10*3/uL (ref 0.1–1.0)
Monocytes Relative: 2 %
Neutro Abs: 3.7 10*3/uL (ref 1.7–7.7)
Neutrophils Relative %: 56 %
Platelet Count: 193 10*3/uL (ref 150–400)
RBC: 4.02 MIL/uL — ABNORMAL LOW (ref 4.22–5.81)
RDW: 12.6 % (ref 11.5–15.5)
WBC Count: 6.6 10*3/uL (ref 4.0–10.5)
nRBC: 0 % (ref 0.0–0.2)

## 2023-10-27 MED ORDER — OLANZAPINE 10 MG PO TABS: 10.0000 mg | ORAL_TABLET | Freq: Every day | ORAL | 1 refills | Status: DC

## 2023-10-27 MED ORDER — HEPARIN SOD (PORK) LOCK FLUSH 100 UNIT/ML IV SOLN
500.0000 [IU] | Freq: Once | INTRAVENOUS | Status: AC
  Administered 2023-10-27: 500 [IU] via INTRAVENOUS
  Filled 2023-10-27: qty 5

## 2023-10-27 MED ORDER — SODIUM CHLORIDE 0.9% FLUSH
10.0000 mL | Freq: Once | INTRAVENOUS | Status: AC
  Administered 2023-10-27: 10 mL via INTRAVENOUS
  Filled 2023-10-27: qty 10
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
## 2023-11-10 ENCOUNTER — Inpatient Hospital Stay: Payer: Medicare PPO | Attending: Internal Medicine

## 2023-11-10 ENCOUNTER — Inpatient Hospital Stay: Payer: Medicare PPO

## 2023-11-10 ENCOUNTER — Encounter: Payer: Self-pay | Admitting: Internal Medicine

## 2023-11-10 ENCOUNTER — Inpatient Hospital Stay (HOSPITAL_BASED_OUTPATIENT_CLINIC_OR_DEPARTMENT_OTHER): Payer: Medicare PPO | Admitting: Internal Medicine

## 2023-11-10 VITALS — BP 104/76 | HR 76

## 2023-11-10 DIAGNOSIS — C3412 Malignant neoplasm of upper lobe, left bronchus or lung: Secondary | ICD-10-CM | POA: Insufficient documentation

## 2023-11-10 DIAGNOSIS — Z5112 Encounter for antineoplastic immunotherapy: Secondary | ICD-10-CM | POA: Insufficient documentation

## 2023-11-10 DIAGNOSIS — C7971 Secondary malignant neoplasm of right adrenal gland: Secondary | ICD-10-CM | POA: Insufficient documentation

## 2023-11-10 DIAGNOSIS — C7951 Secondary malignant neoplasm of bone: Secondary | ICD-10-CM | POA: Insufficient documentation

## 2023-11-10 DIAGNOSIS — Z79899 Other long term (current) drug therapy: Secondary | ICD-10-CM | POA: Insufficient documentation

## 2023-11-10 LAB — CMP (CANCER CENTER ONLY)
ALT: 33 U/L (ref 0–44)
AST: 34 U/L (ref 15–41)
Albumin: 3 g/dL — ABNORMAL LOW (ref 3.5–5.0)
Alkaline Phosphatase: 137 U/L — ABNORMAL HIGH (ref 38–126)
Anion gap: 12 (ref 5–15)
BUN: 28 mg/dL — ABNORMAL HIGH (ref 8–23)
CO2: 22 mmol/L (ref 22–32)
Calcium: 9.2 mg/dL (ref 8.9–10.3)
Chloride: 103 mmol/L (ref 98–111)
Creatinine: 1.26 mg/dL — ABNORMAL HIGH (ref 0.61–1.24)
GFR, Estimated: >60 mL/min (ref >60)
Glucose, Bld: 148 mg/dL — ABNORMAL HIGH (ref 70–99)
Potassium: 3.9 mmol/L (ref 3.5–5.1)
Sodium: 137 mmol/L (ref 135–145)
Total Bilirubin: 0.4 mg/dL (ref 0.0–1.2)
Total Protein: 7.5 g/dL (ref 6.5–8.1)

## 2023-11-10 LAB — CBC WITH DIFFERENTIAL (CANCER CENTER ONLY)
Abs Immature Granulocytes: 0.27 10*3/uL — ABNORMAL HIGH (ref 0.00–0.07)
Basophils Absolute: 0 10*3/uL (ref 0.0–0.1)
Basophils Relative: 0 %
Eosinophils Absolute: 0 10*3/uL (ref 0.0–0.5)
Eosinophils Relative: 0 %
HCT: 38.8 % — ABNORMAL LOW (ref 39.0–52.0)
Hemoglobin: 12.8 g/dL — ABNORMAL LOW (ref 13.0–17.0)
Immature Granulocytes: 2 %
Lymphocytes Relative: 24 %
Lymphs Abs: 2.9 10*3/uL (ref 0.7–4.0)
MCH: 32.7 pg (ref 26.0–34.0)
MCHC: 33 g/dL (ref 30.0–36.0)
MCV: 99.2 fL (ref 80.0–100.0)
Monocytes Absolute: 1.3 10*3/uL — ABNORMAL HIGH (ref 0.1–1.0)
Monocytes Relative: 11 %
Neutro Abs: 7.6 10*3/uL (ref 1.7–7.7)
Neutrophils Relative %: 63 %
Platelet Count: 518 10*3/uL — ABNORMAL HIGH (ref 150–400)
RBC: 3.91 MIL/uL — ABNORMAL LOW (ref 4.22–5.81)
RDW: 13.2 % (ref 11.5–15.5)
WBC Count: 12.2 10*3/uL — ABNORMAL HIGH (ref 4.0–10.5)
nRBC: 0 % (ref 0.0–0.2)

## 2023-11-10 LAB — LACTATE DEHYDROGENASE: LDH: 148 U/L (ref 98–192)

## 2023-11-10 MED ORDER — OXYCODONE HCL 15 MG PO TABS: 15.0000 mg | ORAL_TABLET | Freq: Three times a day (TID) | ORAL | 0 refills | Status: DC | PRN

## 2023-11-10 MED ORDER — SODIUM CHLORIDE 0.9 % IV SOLN
Freq: Once | INTRAVENOUS | Status: AC
  Filled 2023-11-10: qty 250

## 2023-11-10 MED ORDER — SODIUM CHLORIDE 0.9 % IV SOLN
INTRAVENOUS | Status: DC
  Filled 2023-11-10: qty 250

## 2023-11-10 MED ORDER — ONDANSETRON HCL 8 MG PO TABS: ORAL_TABLET | ORAL | 1 refills | Status: DC

## 2023-11-10 MED ORDER — DEXAMETHASONE SODIUM PHOSPHATE 10 MG/ML IJ SOLN
10.0000 mg | Freq: Once | INTRAMUSCULAR | Status: AC
  Administered 2023-11-10: 10 mg via INTRAVENOUS
  Filled 2023-11-10: qty 1

## 2023-11-10 MED ORDER — LEVOTHYROXINE SODIUM 125 MCG PO TABS: 125.0000 ug | ORAL_TABLET | Freq: Every day | ORAL | 4 refills | Status: DC

## 2023-11-10 MED ORDER — HEPARIN SOD (PORK) LOCK FLUSH 100 UNIT/ML IV SOLN
500.0000 [IU] | Freq: Once | INTRAVENOUS | Status: AC | PRN
  Administered 2023-11-10: 500 [IU]
  Filled 2023-11-10: qty 5

## 2023-11-10 MED ORDER — PALONOSETRON HCL INJECTION 0.25 MG/5ML
0.2500 mg | Freq: Once | INTRAVENOUS | Status: AC
  Administered 2023-11-10: 0.25 mg via INTRAVENOUS
  Filled 2023-11-10: qty 5

## 2023-11-10 MED ORDER — SODIUM CHLORIDE 0.9 % IV SOLN
3.2000 mg/m2 | Freq: Once | INTRAVENOUS | Status: AC
  Administered 2023-11-10: 6.25 mg via INTRAVENOUS
  Filled 2023-11-10: qty 12.5

## 2023-11-10 MED ORDER — PREDNISONE 20 MG PO TABS: ORAL_TABLET | ORAL | 0 refills | Status: DC

## 2023-11-10 NOTE — Progress Notes (Signed)
 Nutrition Follow-up:  Patient with lung cancer, stage IV.  Currently on lurbinectedin .  Met with patient and wife during infusion.  Patient reports that he has been taking the 10mg  of olanzapine  since Palliative NP increased dose on 1/21.  He has not noticed an increase in appetite, continues to be low.  Drinking 1-2 boost shakes a day.  Has drank a boost shake and eaten cereal today but nothing else.  Reports some nausea yesterday and took a nausea pill.  Reports having bowel movement, last one was yesterday.  Taking miralax in coffee each morning.     Medications: reviewed  Labs: reviewed  Anthropometrics:   Weight 144 lb today  144 lb on 1/21 158 lb on 1/15 166 lb on 12/24 177 lb 12.8 oz on 4/29   NUTRITION DIAGNOSIS: Inadequate oral intake continues    INTERVENTION:  Patient has follow-up appointment with Palliative NP on 2/11 Continue boost shakes as often as tolerated Encouraged small frequent meals of high calorie, high protein foods   NEXT VISIT: Wednesday, Feb 26 during infusion  Nadia Viar B. Dasie, RD, LDN Registered Dietitian [phone]

## 2023-11-10 NOTE — Patient Instructions (Signed)
 [facility] - A DEPT OF [facility]  Discharge Instructions: Thank you for choosing [facility] to provide your oncology and hematology care.  If you have a lab appointment with the Cancer Center, please go directly to the Cancer Center and check in at the registration area.  Wear comfortable clothing and clothing appropriate for easy access to any Portacath or PICC line.   We strive to give you quality time with your provider. You may need to reschedule your appointment if you arrive late (15 or more minutes).  Arriving late affects you and other patients whose appointments are after yours.  Also, if you miss three or more appointments without notifying the office, you may be dismissed from the clinic at the provider's discretion.      For prescription refill requests, have your pharmacy contact our office and allow 72 hours for refills to be completed.    Today you received the following chemotherapy and/or immunotherapy agents Zepzelca      To help prevent nausea and vomiting after your treatment, we encourage you to take your nausea medication as directed.  BELOW ARE SYMPTOMS THAT SHOULD BE REPORTED IMMEDIATELY: *FEVER GREATER THAN 100.4 F (38 C) OR HIGHER *CHILLS OR SWEATING *NAUSEA AND VOMITING THAT IS NOT CONTROLLED WITH YOUR NAUSEA MEDICATION *UNUSUAL SHORTNESS OF BREATH *UNUSUAL BRUISING OR BLEEDING *URINARY PROBLEMS (pain or burning when urinating, or frequent urination) *BOWEL PROBLEMS (unusual diarrhea, constipation, pain near the anus) TENDERNESS IN MOUTH AND THROAT WITH OR WITHOUT PRESENCE OF ULCERS (sore throat, sores in mouth, or a toothache) UNUSUAL RASH, SWELLING OR PAIN  UNUSUAL VAGINAL DISCHARGE OR ITCHING   Items with * indicate a potential emergency and should be followed up as soon as possible or go to the Emergency Department if any problems should occur.  Please show the CHEMOTHERAPY ALERT CARD or IMMUNOTHERAPY  ALERT CARD at check-in to the Emergency Department and triage nurse.  Should you have questions after your visit or need to cancel or reschedule your appointment, please contact [facility] - A DEPT OF [facility]  [phone] and follow the prompts.  Office hours are 8:00 a.m. to 4:30 p.m. Monday - Friday. Please note that voicemails left after 4:00 p.m. may not be returned until the following business day.  We are closed weekends and major holidays. You have access to a nurse at all times for urgent questions. Please call the main number to the clinic [phone] and follow the prompts.  For any non-urgent questions, you may also contact your provider using MyChart. We now offer e-Visits for anyone 56 and older to request care online for non-urgent symptoms. For details visit [email or web address].   Also download the MyChart app! Go to the app store, search "MyChart", open the app, select , and log in with your MyChart username and password.

## 2023-11-10 NOTE — Addendum Note (Signed)
Addended by: Darrold Span A on: 11/10/2023 03:20 PM   Modules accepted: Orders

## 2023-11-10 NOTE — Assessment & Plan Note (Addendum)
#   STAGE IV- Left upper lobe lung cancer liver biopsy [FEB, 2024] -small cell lung cancer-most recently just on Atezolizumab . WBRT in OCT 2024. DEC 16th, 2024- Disease progression, as evidenced by new and enlarging left sided lung nodules, significant progression of left mediastinal/hilar adenopathy, enlarged right adrenal metastasis, and developing lower cervical/upper abdominal adenopathy. 5th posterolateral right rib osseous metastasis, relatively similar. JAN 14th-  ON  lubrinectidin every 3 weeks  #  Recommend proceed with lubrinectidin # 2.  # HYPOTENSION: ? Etiology- Recommend stopping Lisnopril- IVF today; and getting  10 mg pre-med. Starting prednisone - 40 mg day x1 week; 20 mg day x 2 weeks; and do not STOP  # # MRI Brain in AUG 2024- s/p whole brain RT [10/09-last]. MRI Brain -dec 2024-significant improvement no evidence of any progression. Stable.   #  Pain- Right posterior fifth rib has an SUV max of 3.20. Right ninth posterior rib has an SUV max of 2.49. Right iliac bone lesion has an SUV max of 4; left femur lesion.  On fenatnl patch 50 mcg; on oxycodone  15 mg q 6 hours.  Follow-up radiation as pain appears to be mostly from his increasing adrenal lesion.  # Hypothyroidism: sec to Tecentriq - on synthroid  150 [increased in OCT] mcg once a day. TSH- JAN 2025- HIGH; will decrease the dose of synthroid  to 125.   Await thyroid  profile from today.  # COPD/cough- continue with inhalers.  Continue Tessalon  Perles/ Tussionex prn. S/p  pulmonary Dr.Dgyali re: COPD- stable.   # Smoking: Active smoker; recommend quitting/cutting down- stable.   # Hoarseness of voice/ choking spells- s/p  ENT evaluation.s/p speech path evaluation- stable.   # Weight loss: Poor appetite- on zyprexa  5 mg at bedtime-stable.   # DISPOSITION: # chemo today; 1lit IVFs over 1 hour # Follow up in APP next week-Tuesday/Thursday- labs- cbc/bmp # follow up  in 3  weeks- MD; labs- cbc/cmp;thyroid  profile;  LDH; chemo;  IVFs over 1 hour-Dr.B

## 2023-11-10 NOTE — Progress Notes (Signed)
Patient needs a refill on his pain medication and his Zofran, he only has enough for about 2 more days. His appetite has been absent for about 3 weeks now his wife states that he is barely eating anything at all. He thinks that he might have a UTI.

## 2023-11-10 NOTE — Patient Instructions (Signed)
#   STOP LISINOPRIL-recommend checking blood pressure at home; if the top number greater than 150 call us.   # Start prednisone as recommended.  Take in the morning starting tomorrow.
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
## 2023-11-11 LAB — THYROID PANEL WITH TSH
Free Thyroxine Index: 4.6 (ref 1.2–4.9)
T3 Uptake Ratio: 38 % (ref 24–39)
T4, Total: 12 ug/dL (ref 4.5–12.0)
TSH: 0.025 u[IU]/mL — ABNORMAL LOW (ref 0.450–4.500)

## 2023-11-17 ENCOUNTER — Other Ambulatory Visit: Payer: Medicare PPO

## 2023-11-17 ENCOUNTER — Telehealth: Payer: Medicare PPO | Admitting: Hospice and Palliative Medicine

## 2023-11-17 ENCOUNTER — Ambulatory Visit: Payer: Medicare PPO | Admitting: Hospice and Palliative Medicine

## 2023-11-17 ENCOUNTER — Ambulatory Visit: Payer: Medicare PPO

## 2023-11-19 ENCOUNTER — Inpatient Hospital Stay: Payer: Medicare PPO

## 2023-11-19 ENCOUNTER — Inpatient Hospital Stay (HOSPITAL_BASED_OUTPATIENT_CLINIC_OR_DEPARTMENT_OTHER): Payer: Medicare PPO | Admitting: Hospice and Palliative Medicine

## 2023-11-19 ENCOUNTER — Encounter: Payer: Self-pay | Admitting: Hospice and Palliative Medicine

## 2023-11-19 VITALS — BP 104/79 | HR 90 | Temp 96.9°F | Resp 20 | Wt 147.7 lb

## 2023-11-19 DIAGNOSIS — Z5112 Encounter for antineoplastic immunotherapy: Secondary | ICD-10-CM | POA: Diagnosis not present

## 2023-11-19 DIAGNOSIS — Z95828 Presence of other vascular implants and grafts: Secondary | ICD-10-CM

## 2023-11-19 DIAGNOSIS — C3412 Malignant neoplasm of upper lobe, left bronchus or lung: Secondary | ICD-10-CM | POA: Diagnosis not present

## 2023-11-19 LAB — BASIC METABOLIC PANEL - CANCER CENTER ONLY
Anion gap: 10 (ref 5–15)
BUN: 25 mg/dL — ABNORMAL HIGH (ref 8–23)
CO2: 24 mmol/L (ref 22–32)
Calcium: 8.7 mg/dL — ABNORMAL LOW (ref 8.9–10.3)
Chloride: 106 mmol/L (ref 98–111)
Creatinine: 0.99 mg/dL (ref 0.61–1.24)
GFR, Estimated: >60 mL/min (ref >60)
Glucose, Bld: 118 mg/dL — ABNORMAL HIGH (ref 70–99)
Potassium: 4 mmol/L (ref 3.5–5.1)
Sodium: 140 mmol/L (ref 135–145)

## 2023-11-19 MED ORDER — HEPARIN SOD (PORK) LOCK FLUSH 100 UNIT/ML IV SOLN
500.0000 [IU] | Freq: Once | INTRAVENOUS | Status: AC
Start: 2023-11-19 — End: 2023-11-19
  Administered 2023-11-19: 500 [IU] via INTRAVENOUS
  Filled 2023-11-19: qty 5

## 2023-11-19 NOTE — Progress Notes (Signed)
No fluids needed today, port needle deaccessed.
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
## 2023-11-30 ENCOUNTER — Other Ambulatory Visit: Payer: Self-pay | Admitting: *Deleted

## 2023-11-30 MED ORDER — PREDNISONE 20 MG PO TABS: 20.0000 mg | ORAL_TABLET | Freq: Every day | ORAL | 0 refills | Status: DC

## 2023-11-30 MED ORDER — OXYCODONE HCL 15 MG PO TABS: 15.0000 mg | ORAL_TABLET | Freq: Three times a day (TID) | ORAL | 0 refills | Status: DC | PRN

## 2023-12-01 ENCOUNTER — Other Ambulatory Visit: Payer: Self-pay

## 2023-12-01 DIAGNOSIS — C3412 Malignant neoplasm of upper lobe, left bronchus or lung: Secondary | ICD-10-CM

## 2023-12-02 ENCOUNTER — Inpatient Hospital Stay (HOSPITAL_BASED_OUTPATIENT_CLINIC_OR_DEPARTMENT_OTHER): Payer: Medicare PPO | Admitting: Internal Medicine

## 2023-12-02 ENCOUNTER — Inpatient Hospital Stay: Payer: Medicare PPO

## 2023-12-02 ENCOUNTER — Encounter: Payer: Self-pay | Admitting: Internal Medicine

## 2023-12-02 VITALS — BP 104/72 | HR 98 | Temp 100.0°F | Ht 71.0 in | Wt 142.9 lb

## 2023-12-02 DIAGNOSIS — C3412 Malignant neoplasm of upper lobe, left bronchus or lung: Secondary | ICD-10-CM

## 2023-12-02 DIAGNOSIS — Z5112 Encounter for antineoplastic immunotherapy: Secondary | ICD-10-CM | POA: Diagnosis not present

## 2023-12-02 LAB — CBC WITH DIFFERENTIAL (CANCER CENTER ONLY)
Abs Immature Granulocytes: 0.28 10*3/uL — ABNORMAL HIGH (ref 0.00–0.07)
Basophils Absolute: 0.1 10*3/uL (ref 0.0–0.1)
Basophils Relative: 0 %
Eosinophils Absolute: 0 10*3/uL (ref 0.0–0.5)
Eosinophils Relative: 0 %
HCT: 34.9 % — ABNORMAL LOW (ref 39.0–52.0)
Hemoglobin: 11.6 g/dL — ABNORMAL LOW (ref 13.0–17.0)
Immature Granulocytes: 2 %
Lymphocytes Relative: 12 %
Lymphs Abs: 1.7 10*3/uL (ref 0.7–4.0)
MCH: 33.3 pg (ref 26.0–34.0)
MCHC: 33.2 g/dL (ref 30.0–36.0)
MCV: 100.3 fL — ABNORMAL HIGH (ref 80.0–100.0)
Monocytes Absolute: 1 10*3/uL (ref 0.1–1.0)
Monocytes Relative: 7 %
Neutro Abs: 10.9 10*3/uL — ABNORMAL HIGH (ref 1.7–7.7)
Neutrophils Relative %: 79 %
Platelet Count: 259 10*3/uL (ref 150–400)
RBC: 3.48 MIL/uL — ABNORMAL LOW (ref 4.22–5.81)
RDW: 16 % — ABNORMAL HIGH (ref 11.5–15.5)
WBC Count: 14 10*3/uL — ABNORMAL HIGH (ref 4.0–10.5)
nRBC: 0 % (ref 0.0–0.2)

## 2023-12-02 LAB — CMP (CANCER CENTER ONLY)
ALT: 23 U/L (ref 0–44)
AST: 20 U/L (ref 15–41)
Albumin: 2.7 g/dL — ABNORMAL LOW (ref 3.5–5.0)
Alkaline Phosphatase: 126 U/L (ref 38–126)
Anion gap: 10 (ref 5–15)
BUN: 15 mg/dL (ref 8–23)
CO2: 22 mmol/L (ref 22–32)
Calcium: 8.6 mg/dL — ABNORMAL LOW (ref 8.9–10.3)
Chloride: 104 mmol/L (ref 98–111)
Creatinine: 0.87 mg/dL (ref 0.61–1.24)
GFR, Estimated: >60 mL/min (ref >60)
Glucose, Bld: 150 mg/dL — ABNORMAL HIGH (ref 70–99)
Potassium: 3.6 mmol/L (ref 3.5–5.1)
Sodium: 136 mmol/L (ref 135–145)
Total Bilirubin: 0.5 mg/dL (ref 0.0–1.2)
Total Protein: 6.6 g/dL (ref 6.5–8.1)

## 2023-12-02 LAB — LACTATE DEHYDROGENASE: LDH: 153 U/L (ref 98–192)

## 2023-12-02 MED ORDER — SODIUM CHLORIDE 0.9 % IV SOLN
Freq: Once | INTRAVENOUS | Status: AC
Start: 2023-12-02 — End: 2023-12-02
  Filled 2023-12-02: qty 250

## 2023-12-02 MED ORDER — HEPARIN SOD (PORK) LOCK FLUSH 100 UNIT/ML IV SOLN
500.0000 [IU] | Freq: Once | INTRAVENOUS | Status: AC | PRN
  Administered 2023-12-02: 500 [IU]
  Filled 2023-12-02: qty 5

## 2023-12-02 MED ORDER — AZITHROMYCIN 250 MG PO TABS: ORAL_TABLET | ORAL | 0 refills | Status: DC

## 2023-12-02 MED ORDER — FENTANYL 50 MCG/HR TD PT72
1.0000 | MEDICATED_PATCH | TRANSDERMAL | 0 refills | Status: DC
Start: 2023-12-02 — End: 2023-12-31

## 2023-12-02 NOTE — Assessment & Plan Note (Addendum)
#   STAGE IV- Left upper lobe lung cancer liver biopsy [FEB, 2024] -small cell lung cancer-most recently just on Atezolizumab. WBRT in OCT 2024. DEC 16th, 2024- Disease progression, as evidenced by new and enlarging left sided lung nodules, significant progression of left mediastinal/hilar adenopathy, enlarged right adrenal metastasis, and developing lower cervical/upper abdominal adenopathy. 5th posterolateral right rib osseous metastasis, relatively similar. JAN 14th-  ON  lubrinectidin every 3 weeks  # HOLD lubrinectidin # 3 given fever Labs-CBC/chemistries were reviewed with the patient. Will plan imaging after 3 cycles of chemo.   # Low grade Temp- 100/ Leucocytosis- 14; - ? Etiology/ HOLD off chemo- check flu/COVID. Recommend evaluation in Urgent care. Start Z-pak.   # HYPOTENSION: ? Etiology-Currently OFF Lisnopril- I currently recommend prednisone 20 mg/day- improved. Proceed with IVFs today.   # # MRI Brain in AUG 2024- s/p whole brain RT [10/09-last]. MRI Brain -dec 2024-significant improvement no evidence of any progression. Stable.   #  Pain- Right posterior fifth rib has an SUV max of 3.20. Right ninth posterior rib has an SUV max of 2.49. Right iliac bone lesion has an SUV max of 4; left femur lesion.  On fentanyl patch 50 mcg; on oxycodone 15 mg q 6 hours.   Stable.   # Hypothyroidism: sec to Tecentriq- on synthroid 150 [increased in OCT] mcg once a day. TSH- JAN 2025- LOW;  continue current dose of synthroid to 125.   Await thyroid profile from today.  # COPD/cough- continue with inhalers.  Continue Tessalon Perles/ Tussionex prn. S/p  pulmonary Dr.Dgyali re: COPD- stable.   # Smoking: Active smoker; recommend quitting/cutting down- stable.   # Hoarseness of voice/ choking spells- s/p  ENT evaluation.s/p speech path evaluation- stable.   # Weight loss: Poor appetite- on zyprexa 5 mg at bedtime-stable.   # DISPOSITION: # HOLD chemo today; 1lit IVFs over 1 hour # follow up  in 1  week- MD; labs- cbc/cmp;   LDH; chemo; IVFs over 1 hour-Dr.B

## 2023-12-02 NOTE — Progress Notes (Signed)
 Nutrition  RD scheduled to see patient during infusion but no treatment today only fluids and finished early.  RD unable to get to infusion room before finishing so missed patient today.  Will follow-up at next treatment.   Raymar Joiner B. Freida Busman, RD, LDN Registered Dietitian [phone]
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
## 2023-12-02 NOTE — Progress Notes (Signed)
 C/o being weak today. Temp 100.6.  Refills on patch, oxy,  pended.  Appetite 75% normal, boost 1-2 per day.

## 2023-12-03 ENCOUNTER — Encounter: Payer: Self-pay | Admitting: Internal Medicine

## 2023-12-03 LAB — THYROID PANEL WITH TSH
Free Thyroxine Index: 2.7 (ref 1.2–4.9)
T3 Uptake Ratio: 32 % (ref 24–39)
T4, Total: 8.3 ug/dL (ref 4.5–12.0)
TSH: 0.16 u[IU]/mL — ABNORMAL LOW (ref 0.450–4.500)

## 2023-12-10 ENCOUNTER — Inpatient Hospital Stay (HOSPITAL_BASED_OUTPATIENT_CLINIC_OR_DEPARTMENT_OTHER): Admitting: Internal Medicine

## 2023-12-10 ENCOUNTER — Inpatient Hospital Stay: Payer: Medicare PPO

## 2023-12-10 ENCOUNTER — Inpatient Hospital Stay: Payer: Medicare PPO | Attending: Internal Medicine

## 2023-12-10 ENCOUNTER — Encounter: Payer: Self-pay | Admitting: Internal Medicine

## 2023-12-10 ENCOUNTER — Inpatient Hospital Stay

## 2023-12-10 ENCOUNTER — Inpatient Hospital Stay: Payer: Medicare PPO | Admitting: Internal Medicine

## 2023-12-10 VITALS — BP 128/93 | HR 119 | Temp 95.9°F | Resp 18 | Ht 71.0 in | Wt 149.0 lb

## 2023-12-10 DIAGNOSIS — Z79890 Hormone replacement therapy (postmenopausal): Secondary | ICD-10-CM | POA: Insufficient documentation

## 2023-12-10 DIAGNOSIS — C7971 Secondary malignant neoplasm of right adrenal gland: Secondary | ICD-10-CM | POA: Insufficient documentation

## 2023-12-10 DIAGNOSIS — Z79899 Other long term (current) drug therapy: Secondary | ICD-10-CM | POA: Diagnosis not present

## 2023-12-10 DIAGNOSIS — F17210 Nicotine dependence, cigarettes, uncomplicated: Secondary | ICD-10-CM | POA: Diagnosis not present

## 2023-12-10 DIAGNOSIS — C787 Secondary malignant neoplasm of liver and intrahepatic bile duct: Secondary | ICD-10-CM | POA: Insufficient documentation

## 2023-12-10 DIAGNOSIS — C3412 Malignant neoplasm of upper lobe, left bronchus or lung: Secondary | ICD-10-CM

## 2023-12-10 DIAGNOSIS — C7951 Secondary malignant neoplasm of bone: Secondary | ICD-10-CM | POA: Insufficient documentation

## 2023-12-10 DIAGNOSIS — E039 Hypothyroidism, unspecified: Secondary | ICD-10-CM | POA: Diagnosis not present

## 2023-12-10 DIAGNOSIS — Z5112 Encounter for antineoplastic immunotherapy: Secondary | ICD-10-CM | POA: Insufficient documentation

## 2023-12-10 DIAGNOSIS — J449 Chronic obstructive pulmonary disease, unspecified: Secondary | ICD-10-CM | POA: Insufficient documentation

## 2023-12-10 LAB — CBC WITH DIFFERENTIAL (CANCER CENTER ONLY)
Abs Immature Granulocytes: 0.36 10*3/uL — ABNORMAL HIGH (ref 0.00–0.07)
Basophils Absolute: 0 10*3/uL (ref 0.0–0.1)
Basophils Relative: 0 %
Eosinophils Absolute: 0.1 10*3/uL (ref 0.0–0.5)
Eosinophils Relative: 1 %
HCT: 37.4 % — ABNORMAL LOW (ref 39.0–52.0)
Hemoglobin: 12 g/dL — ABNORMAL LOW (ref 13.0–17.0)
Immature Granulocytes: 2 %
Lymphocytes Relative: 15 %
Lymphs Abs: 2.5 10*3/uL (ref 0.7–4.0)
MCH: 32.3 pg (ref 26.0–34.0)
MCHC: 32.1 g/dL (ref 30.0–36.0)
MCV: 100.8 fL — ABNORMAL HIGH (ref 80.0–100.0)
Monocytes Absolute: 1.1 10*3/uL — ABNORMAL HIGH (ref 0.1–1.0)
Monocytes Relative: 7 %
Neutro Abs: 12.8 10*3/uL — ABNORMAL HIGH (ref 1.7–7.7)
Neutrophils Relative %: 75 %
Platelet Count: 354 10*3/uL (ref 150–400)
RBC: 3.71 MIL/uL — ABNORMAL LOW (ref 4.22–5.81)
RDW: 16 % — ABNORMAL HIGH (ref 11.5–15.5)
WBC Count: 16.9 10*3/uL — ABNORMAL HIGH (ref 4.0–10.5)
nRBC: 0 % (ref 0.0–0.2)

## 2023-12-10 LAB — CMP (CANCER CENTER ONLY)
ALT: 23 U/L (ref 0–44)
AST: 20 U/L (ref 15–41)
Albumin: 2.7 g/dL — ABNORMAL LOW (ref 3.5–5.0)
Alkaline Phosphatase: 139 U/L — ABNORMAL HIGH (ref 38–126)
Anion gap: 9 (ref 5–15)
BUN: 16 mg/dL (ref 8–23)
CO2: 25 mmol/L (ref 22–32)
Calcium: 9 mg/dL (ref 8.9–10.3)
Chloride: 105 mmol/L (ref 98–111)
Creatinine: 0.99 mg/dL (ref 0.61–1.24)
GFR, Estimated: >60 mL/min (ref >60)
Glucose, Bld: 119 mg/dL — ABNORMAL HIGH (ref 70–99)
Potassium: 3.6 mmol/L (ref 3.5–5.1)
Sodium: 139 mmol/L (ref 135–145)
Total Bilirubin: 0.7 mg/dL (ref 0.0–1.2)
Total Protein: 7.2 g/dL (ref 6.5–8.1)

## 2023-12-10 MED ORDER — DEXAMETHASONE SODIUM PHOSPHATE 10 MG/ML IJ SOLN
10.0000 mg | Freq: Once | INTRAMUSCULAR | Status: AC
  Administered 2023-12-10: 10 mg via INTRAVENOUS
  Filled 2023-12-10: qty 1

## 2023-12-10 MED ORDER — PALONOSETRON HCL INJECTION 0.25 MG/5ML
0.2500 mg | Freq: Once | INTRAVENOUS | Status: AC
  Administered 2023-12-10: 0.25 mg via INTRAVENOUS
  Filled 2023-12-10: qty 5

## 2023-12-10 MED ORDER — LURBINECTEDIN CHEMO IV INJECTION 4 MG
3.2000 mg/m2 | Freq: Once | INTRAVENOUS | Status: AC
  Administered 2023-12-10: 6.25 mg via INTRAVENOUS
  Filled 2023-12-10: qty 12.5

## 2023-12-10 MED ORDER — SODIUM CHLORIDE 0.9 % IV SOLN
INTRAVENOUS | Status: DC
  Filled 2023-12-10: qty 250

## 2023-12-10 MED ORDER — HEPARIN SOD (PORK) LOCK FLUSH 100 UNIT/ML IV SOLN
500.0000 [IU] | Freq: Once | INTRAVENOUS | Status: DC | PRN
  Filled 2023-12-10: qty 5

## 2023-12-10 MED ORDER — SODIUM CHLORIDE 0.9 % IV SOLN
Freq: Once | INTRAVENOUS | Status: AC
  Filled 2023-12-10: qty 250

## 2023-12-10 MED ORDER — CARVEDILOL 12.5 MG PO TABS: 12.5000 mg | ORAL_TABLET | Freq: Two times a day (BID) | ORAL | 3 refills | Status: DC

## 2023-12-10 NOTE — Patient Instructions (Signed)

## 2023-12-10 NOTE — Patient Instructions (Signed)
 [facility] - A DEPT OF [facility]  Discharge Instructions: Thank you for choosing [facility] to provide your oncology and hematology care.  If you have a lab appointment with the Cancer Center, please go directly to the Cancer Center and check in at the registration area.  Wear comfortable clothing and clothing appropriate for easy access to any Portacath or PICC line.   We strive to give you quality time with your provider. You may need to reschedule your appointment if you arrive late (15 or more minutes).  Arriving late affects you and other patients whose appointments are after yours.  Also, if you miss three or more appointments without notifying the office, you may be dismissed from the clinic at the provider's discretion.      For prescription refill requests, have your pharmacy contact our office and allow 72 hours for refills to be completed.    Today you received the following chemotherapy and/or immunotherapy agents [facility]      To help prevent nausea and vomiting after your treatment, we encourage you to take your nausea medication as directed.  BELOW ARE SYMPTOMS THAT SHOULD BE REPORTED IMMEDIATELY: *FEVER GREATER THAN 100.4 F (38 C) OR HIGHER *CHILLS OR SWEATING *NAUSEA AND VOMITING THAT IS NOT CONTROLLED WITH YOUR NAUSEA MEDICATION *UNUSUAL SHORTNESS OF BREATH *UNUSUAL BRUISING OR BLEEDING *URINARY PROBLEMS (pain or burning when urinating, or frequent urination) *BOWEL PROBLEMS (unusual diarrhea, constipation, pain near the anus) TENDERNESS IN MOUTH AND THROAT WITH OR WITHOUT PRESENCE OF ULCERS (sore throat, sores in mouth, or a toothache) UNUSUAL RASH, SWELLING OR PAIN  UNUSUAL VAGINAL DISCHARGE OR ITCHING   Items with * indicate a potential emergency and should be followed up as soon as possible or go to the Emergency Department if any problems should occur.  Please show the CHEMOTHERAPY ALERT CARD or  IMMUNOTHERAPY ALERT CARD at check-in to the Emergency Department and triage nurse.  Should you have questions after your visit or need to cancel or reschedule your appointment, please contact [facility] - A DEPT OF [facility]  [phone] and follow the prompts.  Office hours are 8:00 a.m. to 4:30 p.m. Monday - Friday. Please note that voicemails left after 4:00 p.m. may not be returned until the following business day.  We are closed weekends and major holidays. You have access to a nurse at all times for urgent questions. Please call the main number to the clinic [phone] and follow the prompts.  For any non-urgent questions, you may also contact your provider using MyChart. We now offer e-Visits for anyone 28 and older to request care online for non-urgent symptoms. For details visit [email or web address].   Also download the MyChart app! Go to the app store, search "MyChart", open the app, select [facility], and log in with your MyChart username and password.

## 2023-12-10 NOTE — Patient Instructions (Signed)
 STOP lisinopril-  START  Coreg- BID. Make sure not taking Atenolol.

## 2023-12-10 NOTE — Progress Notes (Signed)
 Nutrition Follow-up:  Patient with lung cancer, stage IV. Currently on lurbinectedin.  Met with patient during infusion.  Reports that his appetite comes and goes but overall intake is better.  Planning to go to [employer] for lunch today.  Ate cereal for breakfast this am.  Says that he has to be careful swallowing liquids or he will get choked. He has discussed with MD.    Medications: reviewed  Labs: reviewed  Anthropometrics:   Weight 149 lb   144 lb on 2/4 158 lb on 1/15 166 lb on 12/24 177 lb 12.8 oz on 4/29   NUTRITION DIAGNOSIS: Inadequate oral intake continues    INTERVENTION:  Continue to push calories and protein especially on "good" days.   Continue boost shakes for added calories    MONITORING, EVALUATION, GOAL: weight trends, intake   NEXT VISIT: Friday, March 28 during infusion  Martin Gomez B. Freida Busman, RD, LDN Registered Dietitian [phone]

## 2023-12-10 NOTE — Progress Notes (Signed)
 Appetite 50% normal, drinks 1-2 boost per day. He is having some trouble swallowing liquids.
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
## 2023-12-10 NOTE — Assessment & Plan Note (Addendum)
#   STAGE IV- Left upper lobe lung cancer liver biopsy [FEB, 2024] -small cell lung cancer-most recently just on Atezolizumab. WBRT in OCT 2024. DEC 16th, 2024- Disease progression, as evidenced by new and enlarging left sided lung nodules, significant progression of left mediastinal/hilar adenopathy, enlarged right adrenal metastasis, and developing lower cervical/upper abdominal adenopathy. 5th posterolateral right rib osseous metastasis, relatively similar. JAN 14th-  ON  lubrinectidin every 3 weeks  #  proceed with lubrinectidin # 3 given fever Labs-CBC/chemistries were reviewed with the patient. Will plan imaging after 3 cycles of chemo.ordered today.    # Low grade Temp- 100/ Leucocytosis- 14; -s/p   Z-pak- stable;; ? Sec to prednisone.   # Tachycardia- Multifactorial- STOP lisinopril- start Coreg- BID. Make sure not taking Atenolol.   # # MRI Brain in AUG 2024- s/p whole brain RT [10/09-last]. MRI Brain -dec 2024-significant improvement no evidence of any progression.Stable.   #  Pain- Right posterior fifth rib has an SUV max of 3.20. Right ninth posterior rib has an SUV max of 2.49. Right iliac bone lesion has an SUV max of 4; left femur lesion.  On fentanyl patch 50 mcg; on oxycodone 15 mg q 6 hours.   Stable.   # Hypothyroidism: sec to Tecentriq- on synthroid   once a day. TSH- JAN 2025- LOW;  continue current dose of synthroid to 125.   Await thyroid profile from today.  # COPD/cough- continue with inhalers.  Continue Tessalon Perles/ Tussionex prn. S/p  pulmonary Dr.Dgyali re: COPD-  stable.   # Smoking: Active smoker; recommend quitting/cutting down- stable.   # Hoarseness of voice/ choking spells- s/p  ENT evaluation.s/p speech path evaluation- stable.   # Weight loss: Poor appetite- on zyprexa 5 mg at bedtime- stable.   # DISPOSITION: #  chemo today; 1lit IVFs over 1 hour # follow up  in 3  week- MD; labs- cbc/cmp;   LDH; chemo; IVFs over 1 hour; CT CAP prior-- Dr.B

## 2023-12-13 ENCOUNTER — Other Ambulatory Visit: Payer: Self-pay | Admitting: Internal Medicine

## 2023-12-14 ENCOUNTER — Encounter: Payer: Self-pay | Admitting: Internal Medicine

## 2023-12-15 ENCOUNTER — Encounter: Payer: Self-pay | Admitting: Internal Medicine

## 2023-12-20 ENCOUNTER — Other Ambulatory Visit: Payer: Self-pay | Admitting: Hospice and Palliative Medicine

## 2023-12-21 ENCOUNTER — Ambulatory Visit: Admission: RE | Admit: 2023-12-21 | Source: Ambulatory Visit

## 2023-12-21 ENCOUNTER — Encounter: Payer: Self-pay | Admitting: Internal Medicine

## 2023-12-27 ENCOUNTER — Other Ambulatory Visit: Payer: Self-pay | Admitting: Internal Medicine

## 2023-12-28 ENCOUNTER — Encounter: Payer: Self-pay | Admitting: Internal Medicine

## 2023-12-31 ENCOUNTER — Other Ambulatory Visit: Payer: Self-pay | Admitting: *Deleted

## 2023-12-31 DIAGNOSIS — C3412 Malignant neoplasm of upper lobe, left bronchus or lung: Secondary | ICD-10-CM

## 2023-12-31 MED ORDER — FENTANYL 50 MCG/HR TD PT72: 1.0000 | MEDICATED_PATCH | TRANSDERMAL | 0 refills | Status: DC

## 2023-12-31 MED ORDER — OXYCODONE HCL 15 MG PO TABS: 15.0000 mg | ORAL_TABLET | Freq: Three times a day (TID) | ORAL | 0 refills | Status: DC | PRN

## 2024-01-01 ENCOUNTER — Inpatient Hospital Stay

## 2024-01-01 ENCOUNTER — Encounter: Payer: Self-pay | Admitting: Internal Medicine

## 2024-01-01 ENCOUNTER — Inpatient Hospital Stay: Admitting: Internal Medicine

## 2024-01-01 VITALS — BP 166/72

## 2024-01-01 VITALS — BP 99/72 | HR 71 | Temp 96.6°F | Resp 18 | Ht 71.0 in | Wt 148.6 lb

## 2024-01-01 DIAGNOSIS — C3412 Malignant neoplasm of upper lobe, left bronchus or lung: Secondary | ICD-10-CM | POA: Diagnosis not present

## 2024-01-01 DIAGNOSIS — Z5112 Encounter for antineoplastic immunotherapy: Secondary | ICD-10-CM | POA: Diagnosis not present

## 2024-01-01 LAB — CBC WITH DIFFERENTIAL (CANCER CENTER ONLY)
Abs Immature Granulocytes: 1.21 10*3/uL — ABNORMAL HIGH (ref 0.00–0.07)
Basophils Absolute: 0.1 10*3/uL (ref 0.0–0.1)
Basophils Relative: 1 %
Eosinophils Absolute: 0 10*3/uL (ref 0.0–0.5)
Eosinophils Relative: 0 %
HCT: 31.9 % — ABNORMAL LOW (ref 39.0–52.0)
Hemoglobin: 10.1 g/dL — ABNORMAL LOW (ref 13.0–17.0)
Immature Granulocytes: 10 %
Lymphocytes Relative: 15 %
Lymphs Abs: 1.9 10*3/uL (ref 0.7–4.0)
MCH: 32.5 pg (ref 26.0–34.0)
MCHC: 31.7 g/dL (ref 30.0–36.0)
MCV: 102.6 fL — ABNORMAL HIGH (ref 80.0–100.0)
Monocytes Absolute: 0.6 10*3/uL (ref 0.1–1.0)
Monocytes Relative: 5 %
Neutro Abs: 8.6 10*3/uL — ABNORMAL HIGH (ref 1.7–7.7)
Neutrophils Relative %: 69 %
Platelet Count: 324 10*3/uL (ref 150–400)
RBC: 3.11 MIL/uL — ABNORMAL LOW (ref 4.22–5.81)
RDW: 17.2 % — ABNORMAL HIGH (ref 11.5–15.5)
WBC Count: 12.5 10*3/uL — ABNORMAL HIGH (ref 4.0–10.5)
nRBC: 0.5 % — ABNORMAL HIGH (ref 0.0–0.2)

## 2024-01-01 LAB — CMP (CANCER CENTER ONLY)
ALT: 23 U/L (ref 0–44)
AST: 25 U/L (ref 15–41)
Albumin: 2.9 g/dL — ABNORMAL LOW (ref 3.5–5.0)
Alkaline Phosphatase: 126 U/L (ref 38–126)
Anion gap: 11 (ref 5–15)
BUN: 19 mg/dL (ref 8–23)
CO2: 22 mmol/L (ref 22–32)
Calcium: 8.9 mg/dL (ref 8.9–10.3)
Chloride: 102 mmol/L (ref 98–111)
Creatinine: 1.1 mg/dL (ref 0.61–1.24)
GFR, Estimated: >60 mL/min (ref >60)
Glucose, Bld: 132 mg/dL — ABNORMAL HIGH (ref 70–99)
Potassium: 4.6 mmol/L (ref 3.5–5.1)
Sodium: 135 mmol/L (ref 135–145)
Total Bilirubin: 0.5 mg/dL (ref 0.0–1.2)
Total Protein: 6.9 g/dL (ref 6.5–8.1)

## 2024-01-01 LAB — LACTATE DEHYDROGENASE: LDH: 149 U/L (ref 98–192)

## 2024-01-01 MED ORDER — PREDNISONE 20 MG PO TABS: 20.0000 mg | ORAL_TABLET | Freq: Every day | ORAL | 0 refills | Status: DC

## 2024-01-01 MED ORDER — HEPARIN SOD (PORK) LOCK FLUSH 100 UNIT/ML IV SOLN
500.0000 [IU] | Freq: Once | INTRAVENOUS | Status: DC | PRN
  Filled 2024-01-01: qty 5

## 2024-01-01 MED ORDER — SODIUM CHLORIDE 0.9 % IV SOLN
INTRAVENOUS | Status: DC
  Filled 2024-01-01: qty 250

## 2024-01-01 MED ORDER — DEXAMETHASONE SODIUM PHOSPHATE 10 MG/ML IJ SOLN
10.0000 mg | Freq: Once | INTRAMUSCULAR | Status: AC
  Administered 2024-01-01: 10 mg via INTRAVENOUS
  Filled 2024-01-01: qty 1

## 2024-01-01 MED ORDER — PALONOSETRON HCL INJECTION 0.25 MG/5ML
0.2500 mg | Freq: Once | INTRAVENOUS | Status: AC
  Administered 2024-01-01: 0.25 mg via INTRAVENOUS
  Filled 2024-01-01: qty 5

## 2024-01-01 MED ORDER — SODIUM CHLORIDE 0.9 % IV SOLN
3.2000 mg/m2 | Freq: Once | INTRAVENOUS | Status: AC
  Administered 2024-01-01: 6.25 mg via INTRAVENOUS
  Filled 2024-01-01: qty 12.5

## 2024-01-01 MED ORDER — SODIUM CHLORIDE 0.9 % IV SOLN
Freq: Once | INTRAVENOUS | Status: AC
  Filled 2024-01-01: qty 250

## 2024-01-01 NOTE — Patient Instructions (Signed)
 [facility] - A DEPT OF [facility]  Discharge Instructions: Thank you for choosing [facility] to provide your oncology and hematology care.  If you have a lab appointment with the Cancer Center, please go directly to the Cancer Center and check in at the registration area.  Wear comfortable clothing and clothing appropriate for easy access to any Portacath or PICC line.   We strive to give you quality time with your provider. You may need to reschedule your appointment if you arrive late (15 or more minutes).  Arriving late affects you and other patients whose appointments are after yours.  Also, if you miss three or more appointments without notifying the office, you may be dismissed from the clinic at the provider's discretion.      For prescription refill requests, have your pharmacy contact our office and allow 72 hours for refills to be completed.    Today you received the following chemotherapy and/or immunotherapy agents ZEPZELCA      To help prevent nausea and vomiting after your treatment, we encourage you to take your nausea medication as directed.  BELOW ARE SYMPTOMS THAT SHOULD BE REPORTED IMMEDIATELY: *FEVER GREATER THAN 100.4 F (38 C) OR HIGHER *CHILLS OR SWEATING *NAUSEA AND VOMITING THAT IS NOT CONTROLLED WITH YOUR NAUSEA MEDICATION *UNUSUAL SHORTNESS OF BREATH *UNUSUAL BRUISING OR BLEEDING *URINARY PROBLEMS (pain or burning when urinating, or frequent urination) *BOWEL PROBLEMS (unusual diarrhea, constipation, pain near the anus) TENDERNESS IN MOUTH AND THROAT WITH OR WITHOUT PRESENCE OF ULCERS (sore throat, sores in mouth, or a toothache) UNUSUAL RASH, SWELLING OR PAIN  UNUSUAL VAGINAL DISCHARGE OR ITCHING   Items with * indicate a potential emergency and should be followed up as soon as possible or go to the Emergency Department if any problems should occur.  Please show the CHEMOTHERAPY ALERT CARD or IMMUNOTHERAPY  ALERT CARD at check-in to the Emergency Department and triage nurse.  Should you have questions after your visit or need to cancel or reschedule your appointment, please contact [facility] - A DEPT OF [facility]  [phone] and follow the prompts.  Office hours are 8:00 a.m. to 4:30 p.m. Monday - Friday. Please note that voicemails left after 4:00 p.m. may not be returned until the following business day.  We are closed weekends and major holidays. You have access to a nurse at all times for urgent questions. Please call the main number to the clinic [phone] and follow the prompts.  For any non-urgent questions, you may also contact your provider using MyChart. We now offer e-Visits for anyone 74 and older to request care online for non-urgent symptoms. For details visit [email or web address].   Also download the MyChart app! Go to the app store, search "MyChart", open the app, select [place], and log in with your MyChart username and password.  Lurbinectedin Injection What is this medication? LURBINECTEDIN (LOOR bin EK te din) treats lung cancer. It works by slowing down the growth of cancer cells. This medicine may be used for other purposes; ask your health care provider or pharmacist if you have questions. COMMON BRAND NAME(S): ZEPZELCA What should I tell my care team before I take this medication? They need to know if you have any of these conditions: Liver disease Low blood cell levels, such as low white cells, platelets, red blood cells An unusual or allergic reaction to lurbinectedin, other medications, foods, dyes, or  preservatives If you or your partner are pregnant or trying to get pregnant Breastfeeding How should I use this medication? This medication is injected into a vein. It is given by your care team in a hospital or clinic setting. Talk to your care team about the use of this medication in children. Special care may be  needed. Overdosage: If you think you have taken too much of this medicine contact a poison control center or emergency room at once. NOTE: This medicine is only for you. Do not share this medicine with others. What if I miss a dose? Keep appointments for follow-up doses. It is important not to miss your dose. Call your care team if you are unable to keep an appointment. What may interact with this medication? Grapefruit juice or [place] oranges Other medications may affect the way this medication works. Talk with your care team about all of the medications you take. They may suggest changes to your treatment plan to lower the risk of side effects and to make sure your medications work as intended. This list may not describe all possible interactions. Give your health care provider a list of all the medicines, herbs, non-prescription drugs, or dietary supplements you use. Also tell them if you smoke, drink alcohol, or use illegal drugs. Some items may interact with your medicine. What should I watch for while using this medication? Your condition will be monitored carefully while you are receiving this medication. This medication may make you feel generally unwell. This is not uncommon as chemotherapy can affect healthy cells as well as cancer cells. Report any side effects. Continue your course of treatment even though you feel ill unless your care team tells you to stop. This medication may increase your risk of getting an infection. Call your care team for advice if you get a fever, chills, sore throat, or other symptoms of a cold or flu. Do not treat yourself. Try to avoid being around people who are sick. Avoid taking medications that contain aspirin, acetaminophen, ibuprofen, naproxen, or ketoprofen unless instructed by your care team. These medications may hide a fever. Be careful brushing or flossing your teeth or using a toothpick because you may get an infection or bleed more easily. If you  have any dental work done, tell your dentist you are receiving this medication. Talk to your care team if you may be pregnant. Serious birth defects can occur if you take this medication during pregnancy and for 6 months after the last dose. You will need a negative pregnancy test before starting this medication. Contraception is recommended while taking this medication and for 6 months after the last dose. If your partner can get pregnant, use a condom during sex while taking this medication and for 4 months after the last dose. Do not breastfeed while taking this medication and for 2 weeks after the last dose. What side effects may I notice from receiving this medication? Side effects that you should report to your care team as soon as possible: Allergic reactions--skin rash, itching, hives, swelling of the face, lips, tongue, or throat Infection--fever, chills, cough, sore throat, wounds that don't heal, pain or trouble when passing urine, general feeling of discomfort or being unwell Liver injury--right upper belly pain, loss of appetite, nausea, light-colored stool, dark yellow or brown urine, yellowing skin or eyes, unusual weakness or fatigue Low red blood cell level--unusual weakness or fatigue, dizziness, headache, trouble breathing Muscle injury--unusual weakness or fatigue, muscle pain, dark yellow or brown urine,  decrease in amount of urine Painful swelling, warmth, or redness of the skin, blisters or sores at the infusion site Unusual bruising or bleeding Side effects that usually do not require medical attention (report these to your care team if they continue or are bothersome): Constipation Cough Diarrhea Fatigue Loss of appetite Nausea This list may not describe all possible side effects. Call your doctor for medical advice about side effects. You may report side effects to FDA at 1-800-FDA-1088. Where should I keep my medication? This medication is given in a hospital or  clinic. It will not be stored at home. NOTE: This sheet is a summary. It may not cover all possible information. If you have questions about this medicine, talk to your doctor, pharmacist, or health care provider.  2024 Elsevier/Gold Standard (2022-05-06 00:00:00)
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
## 2024-01-01 NOTE — Assessment & Plan Note (Signed)
#   STAGE IV- Left upper lobe lung cancer liver biopsy [FEB, 2024] -small cell lung cancer-most recently just on Atezolizumab. WBRT in OCT 2024. DEC 16th, 2024- Disease progression, as evidenced by new and enlarging left sided lung nodules, significant progression of left mediastinal/hilar adenopathy, enlarged right adrenal metastasis, and developing lower cervical/upper abdominal adenopathy. 5th posterolateral right rib osseous metastasis, relatively similar. JAN 14th-  ON  lubrinectidin every 3 weeks  #  proceed with lubrinectidin # 4  given fever Labs-CBC/chemistries were reviewed with the patient.reminded of re-imaging.   # Tachycardia- Multifactorial- STOP lisinopril- start Coreg- BID; improved- stable.   # # MRI Brain in AUG 2024- s/p whole brain RT [10/09-last]. MRI Brain -dec 2024-significant improvement no evidence of any progression. Stable.   #  Pain- Right posterior fifth rib has an SUV max of 3.20. Right ninth posterior rib has an SUV max of 2.49. Right iliac bone lesion has an SUV max of 4; left femur lesion.  On fentanyl patch 50 mcg; on oxycodone 15 mg q 6 hours.   Stable.   # Hypothyroidism: sec to Tecentriq- on synthroid   once a day. TSH- JAN 2025- LOW;  continue current dose of synthroid to 125.   Await thyroid profile from today.  # COPD/cough- continue with inhalers.  Continue Tessalon Perles/ Tussionex prn. S/p  pulmonary Dr.Dgyali re: COPD-  stable.   # Smoking: Active smoker; recommend quitting/cutting down- stable.   # Hoarseness of voice/ choking spells- s/p  ENT evaluation.s/p speech path evaluation- stable.   # Weight loss: Poor appetite- on zyprexa 10 mg at bedtime-; continue prednisone stable.   # DISPOSITION: #  chemo today; 1lit IVFs over 1 hour # follow up  in 3  week- MD; labs- cbc/cmp;   LDH; chemo; IVFs over 1 hour;  CT CAP- Dr.B

## 2024-01-01 NOTE — Progress Notes (Signed)
 Pt states he only has 2 prednisone pills left, can he have another rx? Says it helps him eat.

## 2024-01-01 NOTE — Progress Notes (Signed)
 Nutrition Follow-up:  Patient with lung cancer, stage IV.  Currently on lurbinectedin.    Patient reports that his appetite is doing pretty good.  Taking prednisone.  Ate 3 poached eggs this am, sausage, toast and milk.  Last night ate a cheeseburger, pound cake with ice cream.  Planning to get a taco when he leaves the cancer center today.  Says that food taste ok. Denies nausea.    Medications: reviewed  Labs: reviewed  Anthropometrics:   Weight 148 lb 9.6 oz   149 lb on 3/6 144 lb on 2/4 158 lb on 1/15 166 lb on 12/24 177 lb 12.8 oz on 4/29   NUTRITION DIAGNOSIS: Inadequate oral intake improving    INTERVENTION:  Continue high calorie, high protein foods to help maintain weight Continue boost shakes     MONITORING, EVALUATION, GOAL: weight trends, intake   NEXT VISIT: as needed  Cammy Sanjurjo B. Elease Hashimoto, CSO, LDN Registered Dietitian [phone]

## 2024-01-03 ENCOUNTER — Other Ambulatory Visit: Payer: Self-pay | Admitting: Internal Medicine

## 2024-01-05 ENCOUNTER — Encounter: Payer: Self-pay | Admitting: Internal Medicine

## 2024-01-07 ENCOUNTER — Telehealth: Payer: Self-pay | Admitting: Internal Medicine

## 2024-01-07 NOTE — Telephone Encounter (Signed)
 Left message for patient informing of appointment change due to being scheduled in the NEW patient slot. Asked him to call back if the new time/day didn't work for him.

## 2024-01-08 ENCOUNTER — Ambulatory Visit: Admission: RE | Admit: 2024-01-08 | Source: Ambulatory Visit

## 2024-01-08 ENCOUNTER — Encounter: Payer: Self-pay | Admitting: Internal Medicine

## 2024-01-08 NOTE — Progress Notes (Signed)
 Wife called that pt having fevers/ chills/ low BPs recommend going to ER/ Call EMS.

## 2024-01-13 ENCOUNTER — Ambulatory Visit: Admission: RE | Admit: 2024-01-13 | Source: Ambulatory Visit

## 2024-01-14 ENCOUNTER — Telehealth: Payer: Self-pay | Admitting: *Deleted

## 2024-01-14 ENCOUNTER — Inpatient Hospital Stay

## 2024-01-14 ENCOUNTER — Other Ambulatory Visit

## 2024-01-14 ENCOUNTER — Other Ambulatory Visit: Payer: Self-pay

## 2024-01-14 ENCOUNTER — Inpatient Hospital Stay: Attending: Internal Medicine | Admitting: Hospice and Palliative Medicine

## 2024-01-14 ENCOUNTER — Encounter: Payer: Self-pay | Admitting: Hospice and Palliative Medicine

## 2024-01-14 VITALS — BP 87/72 | HR 90 | Temp 97.2°F | Resp 18 | Wt 135.4 lb

## 2024-01-14 DIAGNOSIS — Z5112 Encounter for antineoplastic immunotherapy: Secondary | ICD-10-CM | POA: Diagnosis present

## 2024-01-14 DIAGNOSIS — E039 Hypothyroidism, unspecified: Secondary | ICD-10-CM | POA: Insufficient documentation

## 2024-01-14 DIAGNOSIS — R509 Fever, unspecified: Secondary | ICD-10-CM

## 2024-01-14 DIAGNOSIS — Z79899 Other long term (current) drug therapy: Secondary | ICD-10-CM | POA: Insufficient documentation

## 2024-01-14 DIAGNOSIS — C787 Secondary malignant neoplasm of liver and intrahepatic bile duct: Secondary | ICD-10-CM | POA: Insufficient documentation

## 2024-01-14 DIAGNOSIS — Z79890 Hormone replacement therapy (postmenopausal): Secondary | ICD-10-CM | POA: Insufficient documentation

## 2024-01-14 DIAGNOSIS — I10 Essential (primary) hypertension: Secondary | ICD-10-CM | POA: Insufficient documentation

## 2024-01-14 DIAGNOSIS — J432 Centrilobular emphysema: Secondary | ICD-10-CM | POA: Diagnosis not present

## 2024-01-14 DIAGNOSIS — D72829 Elevated white blood cell count, unspecified: Secondary | ICD-10-CM | POA: Insufficient documentation

## 2024-01-14 DIAGNOSIS — R112 Nausea with vomiting, unspecified: Secondary | ICD-10-CM

## 2024-01-14 DIAGNOSIS — C7931 Secondary malignant neoplasm of brain: Secondary | ICD-10-CM

## 2024-01-14 DIAGNOSIS — C7951 Secondary malignant neoplasm of bone: Secondary | ICD-10-CM | POA: Insufficient documentation

## 2024-01-14 DIAGNOSIS — C3412 Malignant neoplasm of upper lobe, left bronchus or lung: Secondary | ICD-10-CM

## 2024-01-14 DIAGNOSIS — K5900 Constipation, unspecified: Secondary | ICD-10-CM | POA: Insufficient documentation

## 2024-01-14 DIAGNOSIS — F17210 Nicotine dependence, cigarettes, uncomplicated: Secondary | ICD-10-CM | POA: Insufficient documentation

## 2024-01-14 DIAGNOSIS — Z515 Encounter for palliative care: Secondary | ICD-10-CM | POA: Diagnosis not present

## 2024-01-14 LAB — CBC WITH DIFFERENTIAL (CANCER CENTER ONLY)
Abs Immature Granulocytes: 0.19 10*3/uL — ABNORMAL HIGH (ref 0.00–0.07)
Basophils Absolute: 0 10*3/uL (ref 0.0–0.1)
Basophils Relative: 0 %
Eosinophils Absolute: 0 10*3/uL (ref 0.0–0.5)
Eosinophils Relative: 0 %
HCT: 27.4 % — ABNORMAL LOW (ref 39.0–52.0)
Hemoglobin: 8.7 g/dL — ABNORMAL LOW (ref 13.0–17.0)
Immature Granulocytes: 3 %
Lymphocytes Relative: 26 %
Lymphs Abs: 1.7 10*3/uL (ref 0.7–4.0)
MCH: 32.8 pg (ref 26.0–34.0)
MCHC: 31.8 g/dL (ref 30.0–36.0)
MCV: 103.4 fL — ABNORMAL HIGH (ref 80.0–100.0)
Monocytes Absolute: 0.7 10*3/uL (ref 0.1–1.0)
Monocytes Relative: 10 %
Neutro Abs: 4.1 10*3/uL (ref 1.7–7.7)
Neutrophils Relative %: 61 %
Platelet Count: 223 10*3/uL (ref 150–400)
RBC: 2.65 MIL/uL — ABNORMAL LOW (ref 4.22–5.81)
RDW: 17.5 % — ABNORMAL HIGH (ref 11.5–15.5)
WBC Count: 6.7 10*3/uL (ref 4.0–10.5)
nRBC: 0 % (ref 0.0–0.2)

## 2024-01-14 LAB — CMP (CANCER CENTER ONLY)
ALT: 21 U/L (ref 0–44)
AST: 17 U/L (ref 15–41)
Albumin: 2.5 g/dL — ABNORMAL LOW (ref 3.5–5.0)
Alkaline Phosphatase: 108 U/L (ref 38–126)
Anion gap: 9 (ref 5–15)
BUN: 24 mg/dL — ABNORMAL HIGH (ref 8–23)
CO2: 23 mmol/L (ref 22–32)
Calcium: 8.7 mg/dL — ABNORMAL LOW (ref 8.9–10.3)
Chloride: 105 mmol/L (ref 98–111)
Creatinine: 1.1 mg/dL (ref 0.61–1.24)
GFR, Estimated: >60 mL/min (ref >60)
Glucose, Bld: 122 mg/dL — ABNORMAL HIGH (ref 70–99)
Potassium: 3.6 mmol/L (ref 3.5–5.1)
Sodium: 137 mmol/L (ref 135–145)
Total Bilirubin: 0.7 mg/dL (ref 0.0–1.2)
Total Protein: 6.6 g/dL (ref 6.5–8.1)

## 2024-01-14 MED ORDER — DEXAMETHASONE SODIUM PHOSPHATE 10 MG/ML IJ SOLN
6.0000 mg | Freq: Once | INTRAMUSCULAR | Status: AC
  Administered 2024-01-14: 6 mg via INTRAVENOUS
  Filled 2024-01-14: qty 1

## 2024-01-14 MED ORDER — SODIUM CHLORIDE 0.9 % IV SOLN
Freq: Once | INTRAVENOUS | Status: AC
  Filled 2024-01-14: qty 250
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
## 2024-01-14 NOTE — Telephone Encounter (Signed)
 Received call from pt's wife stating that pt is not feeling well and has been getting worse over the past few weeks. States that pt is struggling with weakness, low grade fever, decreased appetite, and confusion. Pt's wife has attempted last week and this week to take pt to the ED for further evaluation but pt refuses. Per Sharia Reeve, will attempt to see pt this afternoon in [facility] if family able to bring pt in. Reviewed [facility] appt with pt's wife. She verbalized understanding. Nothing further needed at this time.

## 2024-01-14 NOTE — Progress Notes (Signed)
 Patient feeling very weak and not able to take more than 3 steps before stopping to take a break, sleeping a lot, no appetite.  Has not taken any medications for pat 3 days.

## 2024-01-15 ENCOUNTER — Ambulatory Visit
Admission: RE | Admit: 2024-01-15 | Discharge: 2024-01-15 | Disposition: A | Discharge status: Home / Self Care | Source: Ambulatory Visit | Attending: Internal Medicine | Admitting: Internal Medicine

## 2024-01-15 DIAGNOSIS — C3412 Malignant neoplasm of upper lobe, left bronchus or lung: Secondary | ICD-10-CM | POA: Diagnosis present

## 2024-01-15 MED ORDER — IOHEXOL 300 MG/ML  SOLN
85.0000 mL | Freq: Once | INTRAMUSCULAR | Status: AC | PRN
  Administered 2024-01-15: 85 mL via INTRAVENOUS

## 2024-01-16 ENCOUNTER — Telehealth: Payer: Self-pay | Admitting: Internal Medicine

## 2024-01-16 ENCOUNTER — Other Ambulatory Visit: Payer: Self-pay | Admitting: Internal Medicine

## 2024-01-16 MED ORDER — LEVOFLOXACIN 500 MG PO TABS: 500.0000 mg | ORAL_TABLET | Freq: Every day | ORAL | 0 refills | Status: DC

## 2024-01-16 NOTE — Telephone Encounter (Signed)
 Reviewed the CT scan- partial response noted. However, possible pneumonia or pyelonephritis- start levaquin 500 mg/day.  HOLD chemo- keep lab/MD appt as planned-   Spoke to wife of above plan. Asked wife to call us  if not improved on 4/14.  GB  FYI-Josh

## 2024-01-17 ENCOUNTER — Other Ambulatory Visit: Payer: Self-pay | Admitting: Hospice and Palliative Medicine

## 2024-01-18 ENCOUNTER — Encounter: Payer: Self-pay | Admitting: Internal Medicine

## 2024-01-20 ENCOUNTER — Other Ambulatory Visit: Payer: Self-pay | Admitting: Internal Medicine

## 2024-01-21 ENCOUNTER — Ambulatory Visit: Admitting: Internal Medicine

## 2024-01-21 ENCOUNTER — Ambulatory Visit

## 2024-01-21 ENCOUNTER — Other Ambulatory Visit

## 2024-01-22 ENCOUNTER — Inpatient Hospital Stay

## 2024-01-22 ENCOUNTER — Ambulatory Visit

## 2024-01-22 ENCOUNTER — Telehealth: Payer: Self-pay | Admitting: Internal Medicine

## 2024-01-22 ENCOUNTER — Inpatient Hospital Stay (HOSPITAL_BASED_OUTPATIENT_CLINIC_OR_DEPARTMENT_OTHER): Admitting: Internal Medicine

## 2024-01-22 ENCOUNTER — Encounter: Payer: Self-pay | Admitting: Internal Medicine

## 2024-01-22 DIAGNOSIS — C3412 Malignant neoplasm of upper lobe, left bronchus or lung: Secondary | ICD-10-CM | POA: Diagnosis not present

## 2024-01-22 DIAGNOSIS — Z5112 Encounter for antineoplastic immunotherapy: Secondary | ICD-10-CM | POA: Diagnosis not present

## 2024-01-22 DIAGNOSIS — Z95828 Presence of other vascular implants and grafts: Secondary | ICD-10-CM

## 2024-01-22 LAB — CBC WITH DIFFERENTIAL (CANCER CENTER ONLY)
Abs Immature Granulocytes: 4.38 10*3/uL — ABNORMAL HIGH (ref 0.00–0.07)
Basophils Absolute: 0.2 10*3/uL — ABNORMAL HIGH (ref 0.0–0.1)
Basophils Relative: 1 %
Eosinophils Absolute: 0 10*3/uL (ref 0.0–0.5)
Eosinophils Relative: 0 %
HCT: 28.6 % — ABNORMAL LOW (ref 39.0–52.0)
Hemoglobin: 8.9 g/dL — ABNORMAL LOW (ref 13.0–17.0)
Immature Granulocytes: 18 %
Lymphocytes Relative: 17 %
Lymphs Abs: 4.2 10*3/uL — ABNORMAL HIGH (ref 0.7–4.0)
MCH: 33.1 pg (ref 26.0–34.0)
MCHC: 31.1 g/dL (ref 30.0–36.0)
MCV: 106.3 fL — ABNORMAL HIGH (ref 80.0–100.0)
Monocytes Absolute: 1.4 10*3/uL — ABNORMAL HIGH (ref 0.1–1.0)
Monocytes Relative: 6 %
Neutro Abs: 14.8 10*3/uL — ABNORMAL HIGH (ref 1.7–7.7)
Neutrophils Relative %: 58 %
Platelet Count: 482 10*3/uL — ABNORMAL HIGH (ref 150–400)
RBC: 2.69 MIL/uL — ABNORMAL LOW (ref 4.22–5.81)
RDW: 17.6 % — ABNORMAL HIGH (ref 11.5–15.5)
Smear Review: ADEQUATE
WBC Count: 24.9 10*3/uL — ABNORMAL HIGH (ref 4.0–10.5)
nRBC: 1.4 % — ABNORMAL HIGH (ref 0.0–0.2)

## 2024-01-22 LAB — LACTATE DEHYDROGENASE: LDH: 142 U/L (ref 98–192)

## 2024-01-22 LAB — CMP (CANCER CENTER ONLY)
ALT: 32 U/L (ref 0–44)
AST: 24 U/L (ref 15–41)
Albumin: 2.5 g/dL — ABNORMAL LOW (ref 3.5–5.0)
Alkaline Phosphatase: 124 U/L (ref 38–126)
Anion gap: 10 (ref 5–15)
BUN: 23 mg/dL (ref 8–23)
CO2: 22 mmol/L (ref 22–32)
Calcium: 8.9 mg/dL (ref 8.9–10.3)
Chloride: 104 mmol/L (ref 98–111)
Creatinine: 1.33 mg/dL — ABNORMAL HIGH (ref 0.61–1.24)
GFR, Estimated: 59 mL/min — ABNORMAL LOW (ref >60)
Glucose, Bld: 148 mg/dL — ABNORMAL HIGH (ref 70–99)
Potassium: 3.9 mmol/L (ref 3.5–5.1)
Sodium: 136 mmol/L (ref 135–145)
Total Bilirubin: 0.3 mg/dL (ref 0.0–1.2)
Total Protein: 6.2 g/dL — ABNORMAL LOW (ref 6.5–8.1)

## 2024-01-22 MED ORDER — SODIUM CHLORIDE 0.9% FLUSH
10.0000 mL | Freq: Once | INTRAVENOUS | Status: AC
  Administered 2024-01-22: 10 mL via INTRAVENOUS
  Filled 2024-01-22: qty 10

## 2024-01-22 MED ORDER — HEPARIN SOD (PORK) LOCK FLUSH 100 UNIT/ML IV SOLN
500.0000 [IU] | Freq: Once | INTRAVENOUS | Status: AC
  Administered 2024-01-22: 500 [IU] via INTRAVENOUS
  Filled 2024-01-22: qty 5

## 2024-01-22 MED ORDER — OXYCODONE HCL 15 MG PO TABS: 15.0000 mg | ORAL_TABLET | Freq: Three times a day (TID) | ORAL | 0 refills | Status: DC | PRN

## 2024-01-22 MED ORDER — FENTANYL 50 MCG/HR TD PT72
1.0000 | MEDICATED_PATCH | TRANSDERMAL | 0 refills | Status: AC
Start: 2024-01-22 — End: ?

## 2024-01-22 NOTE — Telephone Encounter (Signed)
 Patient has request for 4/18 treatment in [place]- does this need to be cancelled?

## 2024-01-22 NOTE — Progress Notes (Signed)
 Past 2 days doing better. 1 day of antibiotic left.  Need refills on pain meds, pended  C/o more fatigue, weakness and balance is off. Wife states pt has been getting confused at night.  CT CAP 01/15/24.
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
## 2024-01-22 NOTE — Assessment & Plan Note (Addendum)
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   ON  lubrinectidin every 3 weeks # 4 cycles.   # CT CAP 4/08-2024 .PARTAL RESPONSE NOTED-  Spiculated nodule of the left pulmonary apex is slightly diminished in size consistent with treatment response; Diminished size of a nodular consolidation in the periphery of the left apex. New adjacent consolidation and ground-glass, and significantly increased consolidation and heterogeneous airspace opacity in the medial and perihilar left upper lobe, consistent with developing radiation pneumonitis/fibrosis. Significantly diminished size of matted soft tissue mass in the AP window. Diminished bulk of matted subcarinal and left hilar soft tissue. Similar size of discretely measurable lymph nodes.  When compared to most recent imaging of the upper abdomen dated 09/21/2023, significantly diminished size of upper abdominal lymph nodes. When compared to most recent imaging of the retroperitoneal nodal stations dated 08/11/2023, interval increase in size of numerous matted appearing retroperitoneal lymph nodes. When compared to most recent imaging of the upper abdomen dated 09/21/2023, diminished size of bilateral adrenal metastases.  # Recommend holding chemotherapy given the significant response noted; also in the context of progressive side effects/infectious issues. See below-   # Clinically pyelonephritis-   2.6 x 1.8 cm. LEFT Kidney- Pyelonephritis- antibiotics improvement.  Leukocytosis likely secondary to infection/steroids.  Monitor closely.  # # MRI Brain in AUG 2024- s/p whole brain RT [10/09-last]. MRI Brain -dec 2024-significant improvement no evidence of any progression-given the generalized weakness reasonable to proceed with MRI brain.   #  Pain- Right posterior fifth rib has an SUV max of 3.20. Right ninth posterior rib has an SUV max of 2.49. Right iliac bone lesion has an  SUV max of 4; left femur lesion.  On fentanyl  patch 50 mcg; on oxycodone  15 mg q 6 hours.   Stable.   # Hypothyroidism: sec to Tecentriq - on synthroid    once a day. TSH- JAN 2025- LOW;  continue current dose of synthroid  to 125.   Await thyroid  profile from today.  # COPD/cough- continue with inhalers.  Continue Tessalon  Perles/ Tussionex prn. S/p  pulmonary Dr.Dgyali re: COPD-  stable.   # Smoking: Active smoker; recommend quitting/cutting down- stable.   # Hoarseness of voice/ choking spells- s/p  ENT evaluation.s/p speech path evaluation- stable.   # Weight loss: Poor appetite- on zyprexa  10 mg at bedtime-; continue prednisone  stable.   # PS # DISPOSITION: #  NO  IVFs  # follow up  in 3 weeks or so MD; labs- cbc/cmp;   LDH; NO chemo; possible IVFs over 1 hour; DRI-MRI  brain  -Dr.B  # I reviewed the blood work- with the patient in detail; also reviewed the imaging independently [as summarized above]; and with the patient in detail.   # 40 minutes face-to-face with the patient discussing the above plan of care; more than 50% of time spent on prognosis/ natural history; counseling and coordination.

## 2024-01-24 ENCOUNTER — Other Ambulatory Visit: Payer: Self-pay | Admitting: Internal Medicine

## 2024-01-25 ENCOUNTER — Encounter: Payer: Self-pay | Admitting: Internal Medicine

## 2024-01-25 ENCOUNTER — Other Ambulatory Visit: Payer: Self-pay | Admitting: Internal Medicine

## 2024-01-25 DIAGNOSIS — C3412 Malignant neoplasm of upper lobe, left bronchus or lung: Secondary | ICD-10-CM

## 2024-02-05 ENCOUNTER — Ambulatory Visit
Admission: RE | Admit: 2024-02-05 | Discharge: 2024-02-05 | Disposition: A | Discharge status: Home / Self Care | Source: Ambulatory Visit | Attending: Internal Medicine | Admitting: Internal Medicine

## 2024-02-05 ENCOUNTER — Encounter: Payer: Self-pay | Admitting: Internal Medicine

## 2024-02-05 DIAGNOSIS — C3412 Malignant neoplasm of upper lobe, left bronchus or lung: Secondary | ICD-10-CM

## 2024-02-05 MED ORDER — SODIUM CHLORIDE 0.9% FLUSH
10.0000 mL | INTRAVENOUS | Status: DC | PRN
  Administered 2024-02-05: 10 mL via INTRAVENOUS

## 2024-02-05 MED ORDER — HEPARIN SOD (PORK) LOCK FLUSH 100 UNIT/ML IV SOLN
500.0000 [IU] | Freq: Once | INTRAVENOUS | Status: AC
  Administered 2024-02-05: 500 [IU] via INTRAVENOUS

## 2024-02-05 MED ORDER — GADOPICLENOL 0.5 MMOL/ML IV SOLN
7.5000 mL | Freq: Once | INTRAVENOUS | Status: AC | PRN
  Administered 2024-02-05: 7.5 mL via INTRAVENOUS

## 2024-02-09 ENCOUNTER — Other Ambulatory Visit: Payer: Self-pay | Admitting: Internal Medicine

## 2024-02-12 ENCOUNTER — Telehealth: Payer: Self-pay | Admitting: *Deleted

## 2024-02-12 ENCOUNTER — Inpatient Hospital Stay

## 2024-02-12 ENCOUNTER — Other Ambulatory Visit

## 2024-02-12 ENCOUNTER — Encounter: Payer: Self-pay | Admitting: Internal Medicine

## 2024-02-12 ENCOUNTER — Inpatient Hospital Stay: Attending: Internal Medicine | Admitting: Internal Medicine

## 2024-02-12 VITALS — BP 99/82 | HR 72 | Temp 96.7°F | Resp 20 | Ht 71.0 in | Wt 148.6 lb

## 2024-02-12 DIAGNOSIS — Z79899 Other long term (current) drug therapy: Secondary | ICD-10-CM | POA: Diagnosis not present

## 2024-02-12 DIAGNOSIS — C7951 Secondary malignant neoplasm of bone: Secondary | ICD-10-CM | POA: Insufficient documentation

## 2024-02-12 DIAGNOSIS — Z923 Personal history of irradiation: Secondary | ICD-10-CM | POA: Diagnosis not present

## 2024-02-12 DIAGNOSIS — C7931 Secondary malignant neoplasm of brain: Secondary | ICD-10-CM | POA: Insufficient documentation

## 2024-02-12 DIAGNOSIS — F17210 Nicotine dependence, cigarettes, uncomplicated: Secondary | ICD-10-CM | POA: Diagnosis not present

## 2024-02-12 DIAGNOSIS — C787 Secondary malignant neoplasm of liver and intrahepatic bile duct: Secondary | ICD-10-CM | POA: Diagnosis not present

## 2024-02-12 DIAGNOSIS — Z95828 Presence of other vascular implants and grafts: Secondary | ICD-10-CM

## 2024-02-12 DIAGNOSIS — C3412 Malignant neoplasm of upper lobe, left bronchus or lung: Secondary | ICD-10-CM | POA: Diagnosis not present

## 2024-02-12 DIAGNOSIS — E039 Hypothyroidism, unspecified: Secondary | ICD-10-CM | POA: Diagnosis not present

## 2024-02-12 LAB — CBC WITH DIFFERENTIAL (CANCER CENTER ONLY)
Abs Immature Granulocytes: 0.13 10*3/uL — ABNORMAL HIGH (ref 0.00–0.07)
Basophils Absolute: 0 10*3/uL (ref 0.0–0.1)
Basophils Relative: 0 %
Eosinophils Absolute: 0.1 10*3/uL (ref 0.0–0.5)
Eosinophils Relative: 1 %
HCT: 32.8 % — ABNORMAL LOW (ref 39.0–52.0)
Hemoglobin: 10.2 g/dL — ABNORMAL LOW (ref 13.0–17.0)
Immature Granulocytes: 1 %
Lymphocytes Relative: 15 %
Lymphs Abs: 1.4 10*3/uL (ref 0.7–4.0)
MCH: 33.4 pg (ref 26.0–34.0)
MCHC: 31.1 g/dL (ref 30.0–36.0)
MCV: 107.5 fL — ABNORMAL HIGH (ref 80.0–100.0)
Monocytes Absolute: 0.3 10*3/uL (ref 0.1–1.0)
Monocytes Relative: 3 %
Neutro Abs: 7.7 10*3/uL (ref 1.7–7.7)
Neutrophils Relative %: 80 %
Platelet Count: 235 10*3/uL (ref 150–400)
RBC: 3.05 MIL/uL — ABNORMAL LOW (ref 4.22–5.81)
RDW: 18.9 % — ABNORMAL HIGH (ref 11.5–15.5)
WBC Count: 9.6 10*3/uL (ref 4.0–10.5)
nRBC: 0 % (ref 0.0–0.2)

## 2024-02-12 LAB — CMP (CANCER CENTER ONLY)
ALT: 22 U/L (ref 0–44)
AST: 34 U/L (ref 15–41)
Albumin: 2.9 g/dL — ABNORMAL LOW (ref 3.5–5.0)
Alkaline Phosphatase: 205 U/L — ABNORMAL HIGH (ref 38–126)
Anion gap: 10 (ref 5–15)
BUN: 19 mg/dL (ref 8–23)
CO2: 23 mmol/L (ref 22–32)
Calcium: 8.8 mg/dL — ABNORMAL LOW (ref 8.9–10.3)
Chloride: 103 mmol/L (ref 98–111)
Creatinine: 1.4 mg/dL — ABNORMAL HIGH (ref 0.61–1.24)
GFR, Estimated: 55 mL/min — ABNORMAL LOW (ref >60)
Glucose, Bld: 181 mg/dL — ABNORMAL HIGH (ref 70–99)
Potassium: 4.2 mmol/L (ref 3.5–5.1)
Sodium: 136 mmol/L (ref 135–145)
Total Bilirubin: 0.7 mg/dL (ref 0.0–1.2)
Total Protein: 6.4 g/dL — ABNORMAL LOW (ref 6.5–8.1)

## 2024-02-12 LAB — LACTATE DEHYDROGENASE: LDH: 363 U/L — ABNORMAL HIGH (ref 98–192)

## 2024-02-12 MED ORDER — OXYCODONE HCL 15 MG PO TABS: 15.0000 mg | ORAL_TABLET | Freq: Three times a day (TID) | ORAL | 0 refills | Status: DC | PRN

## 2024-02-12 MED ORDER — HEPARIN SOD (PORK) LOCK FLUSH 100 UNIT/ML IV SOLN
500.0000 [IU] | Freq: Once | INTRAVENOUS | Status: AC
  Administered 2024-02-12: 500 [IU] via INTRAVENOUS
  Filled 2024-02-12: qty 5

## 2024-02-12 MED ORDER — PREDNISONE 20 MG PO TABS: 20.0000 mg | ORAL_TABLET | Freq: Every day | ORAL | 0 refills | Status: DC

## 2024-02-12 NOTE — Telephone Encounter (Signed)
 Radiology sent us  a message about Martin Gomez's MRI and patient supposed to be here today also.  Called radiology and talked to Gabe to let him know that we got the message.  I gave the MRI results to [facility] the nurse and as soon as Dr. Valentine Gasmen comes out of the room he will look at.

## 2024-02-12 NOTE — Progress Notes (Signed)
 Patient states he needs a refill on his oxycodone .  Patient states he wants to discuss his chemo treatments. Patient states he has been having pain on the right side stopping at the right hip area. Patient states the pain been kind of bad this week. Patient was confused on synthroid  medication, he states he recently picked up at 150mg  prescription and wasn't sure if he was supposed to still take the 125 mg also or just the 150mg  prescription.

## 2024-02-12 NOTE — Assessment & Plan Note (Addendum)
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   ON  lubrinectidin every 3 weeks # 4 cycles.   # CT CAP 4/08-2024 .PARTAL RESPONSE NOTED; however May 2025 MRI brain shows progressive disease.  Patient status post whole brain radiation in October 2024.  # Discontinue further therapy given patient progressive disease in the brain-and significant decline in performance status over the last few months.  #  Pain- Right posterior fifth rib has an SUV max of 3.20. Right ninth posterior rib has an SUV max of 2.49. Right iliac bone lesion has an SUV max of 4; left femur lesion.  On fentanyl  patch 50 mcg; on oxycodone  15 mg q 6 hours.  New prescription called in.  Refill.  # Hypothyroidism: sec to Tecentriq - on synthroid    once a day. TSH- JAN 2025- LOW;  continue current dose of synthroid  to 125.  Stable.  # COPD/cough- continue with inhalers.  Continue Tessalon  Perles/ Tussionex prn. S/p  pulmonary Dr.Dgyali re: COPD-stable  # Smoking: Active smoker; recommend quitting/cutting down- stable.   # Hoarseness of voice/ choking spells- s/p  ENT evaluation.s/p speech path evaluation- stable.   # Weight loss: Poor appetite- on zyprexa  10 mg at bedtime-; continue prednisone  stable.   # I had a long discussion with the patient and family given the incurable nature of the disease /poor tolerance of therapy.  I also discussed given general less than 6 months of life expectancy, I introduced hospice philosophy to the patient and family.    Discussed that goal of care should be directed to symptom management rather than treating the underlying disease; and in the process help improve quality of life rather than quantity.  Discussed with hospice team would include-nurse, nurse aide, social worker and chaplain for help take care of patient with physical/emotional needs. Pt is DNR.   # ACP- Josh-   # PS # DISPOSITION: #  NO  IVFs -  de-access # refer to Hospice re: small cell lung cancer  # follow up as needed- -Dr.B  # I reviewed the blood work- with the patient in detail; also reviewed the imaging independently [as summarized above]; and with the patient in detail.   # 40 minutes face-to-face with the patient discussing the above plan of care; more than 50% of time spent on prognosis/ natural history; counseling and coordination.

## 2024-02-12 NOTE — Progress Notes (Signed)
 MRI brain 02/05/24.
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
## 2024-03-02 ENCOUNTER — Encounter: Payer: Self-pay | Admitting: Internal Medicine

## 2024-03-21 ENCOUNTER — Encounter: Payer: Self-pay | Admitting: Internal Medicine

## 2024-03-21 NOTE — Progress Notes (Signed)
 Spoke to patient Wife expressed my condolences. Wife is thankful for the call and the services provided to [facility].

## 2024-04-05 DEATH — deceased
